# Patient Record
Sex: Female | Born: 1960 | Race: White | Hispanic: No | Marital: Married | Smoking: Current every day smoker
Health system: Southern US, Community
[De-identification: ages and names within clinical notes are randomized; demographics above are authoritative.]

## PROBLEM LIST (undated history)

## (undated) DIAGNOSIS — T7840XA Allergy, unspecified, initial encounter: Secondary | ICD-10-CM

## (undated) DIAGNOSIS — L570 Actinic keratosis: Secondary | ICD-10-CM

## (undated) DIAGNOSIS — G43909 Migraine, unspecified, not intractable, without status migrainosus: Secondary | ICD-10-CM

## (undated) DIAGNOSIS — M1990 Unspecified osteoarthritis, unspecified site: Secondary | ICD-10-CM

## (undated) DIAGNOSIS — K219 Gastro-esophageal reflux disease without esophagitis: Secondary | ICD-10-CM

## (undated) DIAGNOSIS — F419 Anxiety disorder, unspecified: Secondary | ICD-10-CM

## (undated) DIAGNOSIS — M25473 Effusion, unspecified ankle: Secondary | ICD-10-CM

## (undated) HISTORY — DX: Migraine, unspecified, not intractable, without status migrainosus: G43.909

## (undated) HISTORY — DX: Gastro-esophageal reflux disease without esophagitis: K21.9

## (undated) HISTORY — PX: ABDOMINAL HYSTERECTOMY: SHX81

## (undated) HISTORY — PX: PARTIAL HYSTERECTOMY: SHX80

## (undated) HISTORY — DX: Allergy, unspecified, initial encounter: T78.40XA

## (undated) HISTORY — DX: Actinic keratosis: L57.0

## (undated) HISTORY — DX: Effusion, unspecified ankle: M25.473

## (undated) HISTORY — PX: FRACTURE SURGERY: SHX138

## (undated) HISTORY — PX: BUNIONECTOMY: SHX129

---
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
## 1981-11-02 HISTORY — PX: OTHER SURGICAL HISTORY: SHX169

## 1984-11-02 HISTORY — PX: APPENDECTOMY: SHX54

## 2005-11-27 ENCOUNTER — Encounter: Payer: Self-pay | Admitting: Cardiovascular Disease

## 2005-12-03 ENCOUNTER — Ambulatory Visit: Payer: Self-pay | Admitting: Unknown Physician Specialty

## 2010-12-19 ENCOUNTER — Encounter: Payer: Self-pay | Admitting: Cardiovascular Disease

## 2010-12-20 ENCOUNTER — Encounter: Payer: Self-pay | Admitting: Cardiovascular Disease

## 2011-01-13 ENCOUNTER — Ambulatory Visit (INDEPENDENT_AMBULATORY_CARE_PROVIDER_SITE_OTHER): Payer: 59 | Admitting: Cardiovascular Disease

## 2011-01-13 ENCOUNTER — Encounter: Payer: Self-pay | Admitting: Cardiovascular Disease

## 2011-01-13 DIAGNOSIS — M79609 Pain in unspecified limb: Secondary | ICD-10-CM | POA: Insufficient documentation

## 2011-01-13 DIAGNOSIS — R002 Palpitations: Secondary | ICD-10-CM | POA: Insufficient documentation

## 2011-01-13 DIAGNOSIS — R209 Unspecified disturbances of skin sensation: Secondary | ICD-10-CM

## 2011-01-13 DIAGNOSIS — R51 Headache: Secondary | ICD-10-CM | POA: Insufficient documentation

## 2011-01-13 DIAGNOSIS — R519 Headache, unspecified: Secondary | ICD-10-CM | POA: Insufficient documentation

## 2011-01-14 ENCOUNTER — Telehealth: Payer: Self-pay | Admitting: Cardiovascular Disease
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
## 2011-01-20 NOTE — Progress Notes (Signed)
Summary: Treadmill  Phone Note Call from Patient Call back at [phone]   Caller: Self Call For: Erin Smith Summary of Call: Pt LMOM that she was calling to schedule a Treadmill for next week.  She needs it to be either Tuesday or Wednesday afternoon. Initial call taken by: Harlon Flor,  January 14, 2011 12:15 PM  Follow-up for Phone Call        pt is calling again to schedule treadmill. Call back [phone] Lysbeth Galas CMA  January 16, 2011 11:21 AM  Attepmted to call pt, LMOM TCB. I am looking at Thursday 01/22/11 @ 12:00, cannot do Tues or Wed. Lanny Hurst RN  January 16, 2011 1:35 PM   Spoke to pt, scheduled treadmill last case per Dr. Mariah Milling for 01/22/11 @ 1200. Pt ok with this. Follow-up by: Lanny Hurst RN,  January 16, 2011 3:24 PM

## 2011-01-22 ENCOUNTER — Ambulatory Visit (INDEPENDENT_AMBULATORY_CARE_PROVIDER_SITE_OTHER): Payer: 59

## 2011-01-22 DIAGNOSIS — R002 Palpitations: Secondary | ICD-10-CM

## 2011-01-22 DIAGNOSIS — R0789 Other chest pain: Secondary | ICD-10-CM

## 2011-01-22 DIAGNOSIS — M79609 Pain in unspecified limb: Secondary | ICD-10-CM

## 2011-01-22 NOTE — Progress Notes (Signed)
Ms. Erin Smith presents for a exercise treadmill today. She has had recent symptoms of arm discomfort radiating up to her neck. Treadmill was ordered to exclude ischemia as a cause of her symptoms.  Resting blood pressure was 110/60. Heart rate was 69 beats per minute. She exercise on a regular Bruce protocol for 10 minutes and 20 seconds. She achieved 12.9 METS. Peak blood pressure was 152/80, maximum heart rate 172 beats per minute. There was no significant ST changes concerning for ischemia. Maximum ST change was 1 mm though upsloping. She was asymptomatic through the treadmill. And in recovery. No symptoms of arm discomfort, neck discomfort chest pain or shortness of breath.  Final impression: Normal treadmill study, excellent exercise tolerance. No suggestion of ischemia based on her treadmill. No further workup is needed at this time. We have recommended continued exercise, monitoring her cholesterol closely. Repeat treadmill can be done in the future if the symptoms arise.

## 2011-02-13 ENCOUNTER — Encounter: Payer: Self-pay | Admitting: Cardiovascular Disease

## 2012-08-28 ENCOUNTER — Ambulatory Visit: Payer: Self-pay | Admitting: Physician Assistant

## 2012-08-28 LAB — RAPID STREP-A WITH REFLX: Micro Text Report: NEGATIVE

## 2012-11-02 HISTORY — PX: COLONOSCOPY: SHX5424

## 2012-11-03 LAB — HM PAP SMEAR: HM PAP: NEGATIVE

## 2013-03-22 IMAGING — CR DG CHEST 2V
1 series · 2 of 2 positions shown · non-contrast
Comparison: none

REASON FOR EXAM: cough and congestion for over a week
COMMENTS:

[Series 1: pa · 0.17mm/px · 2 of 2 slices shown]
[im 1/2]
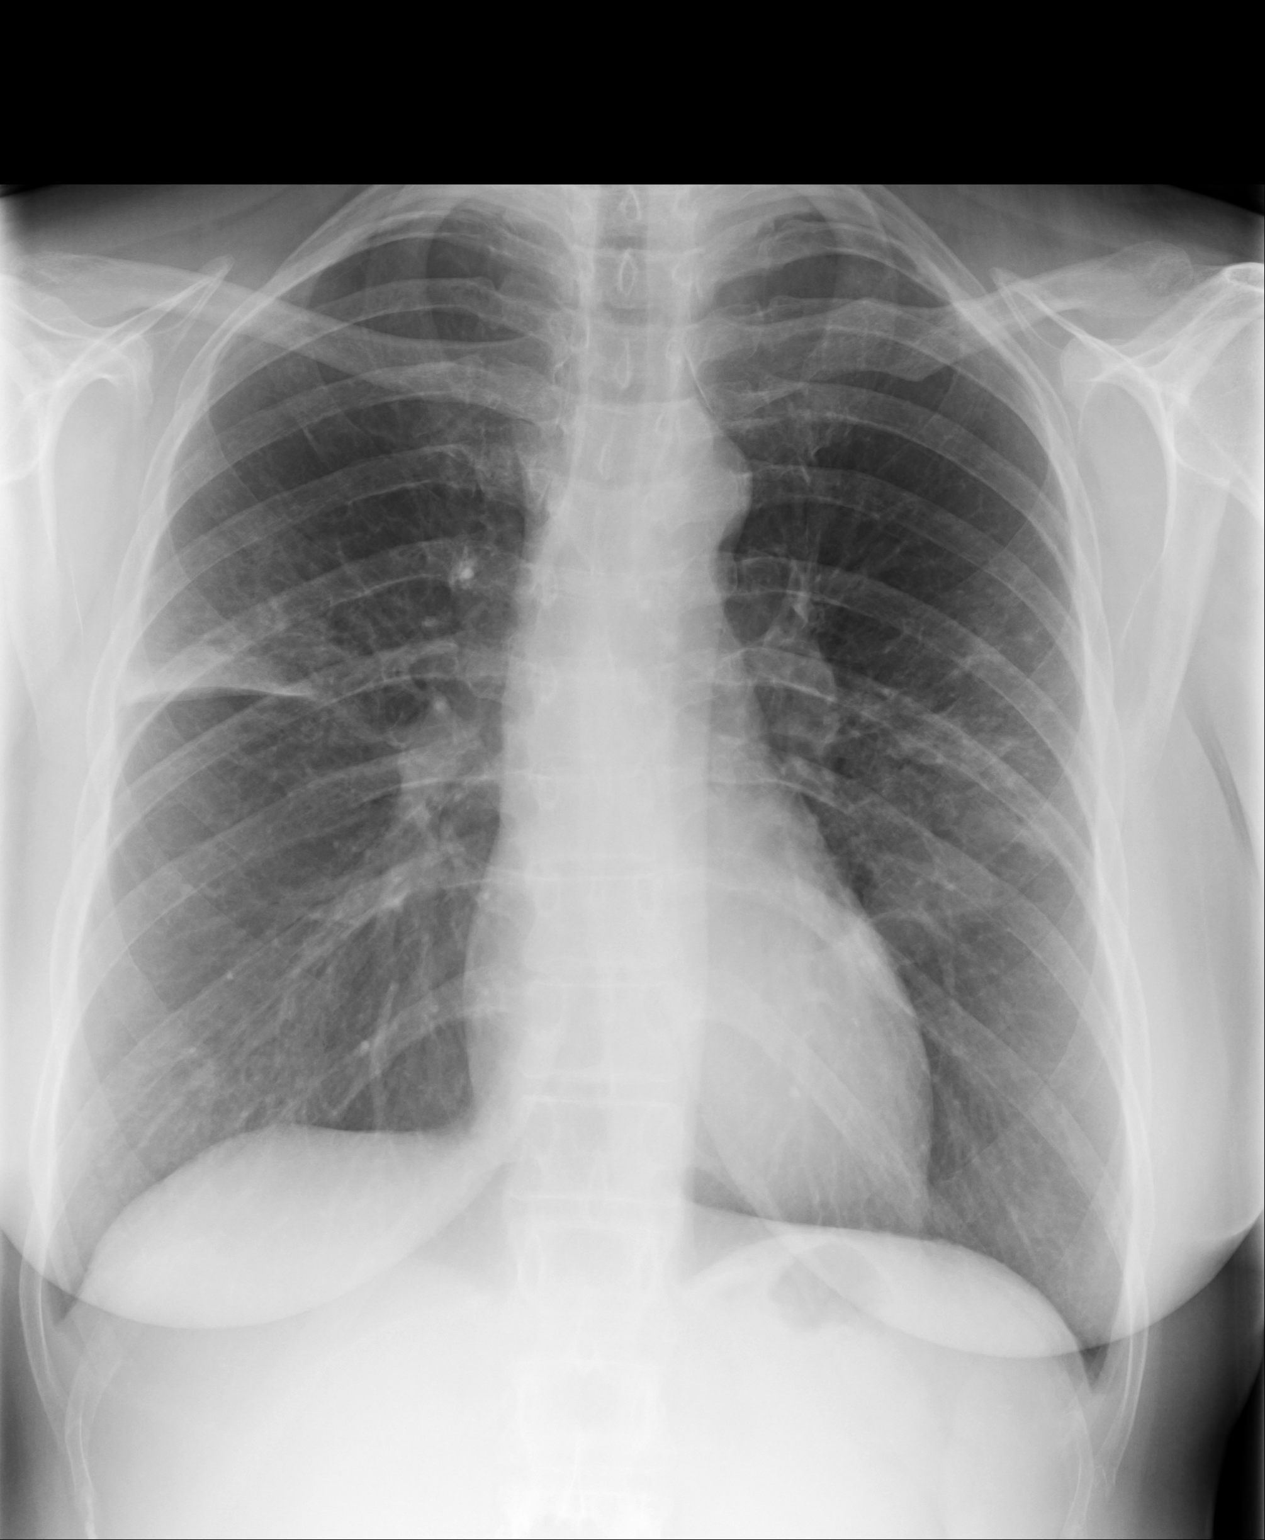
[im 2/2]
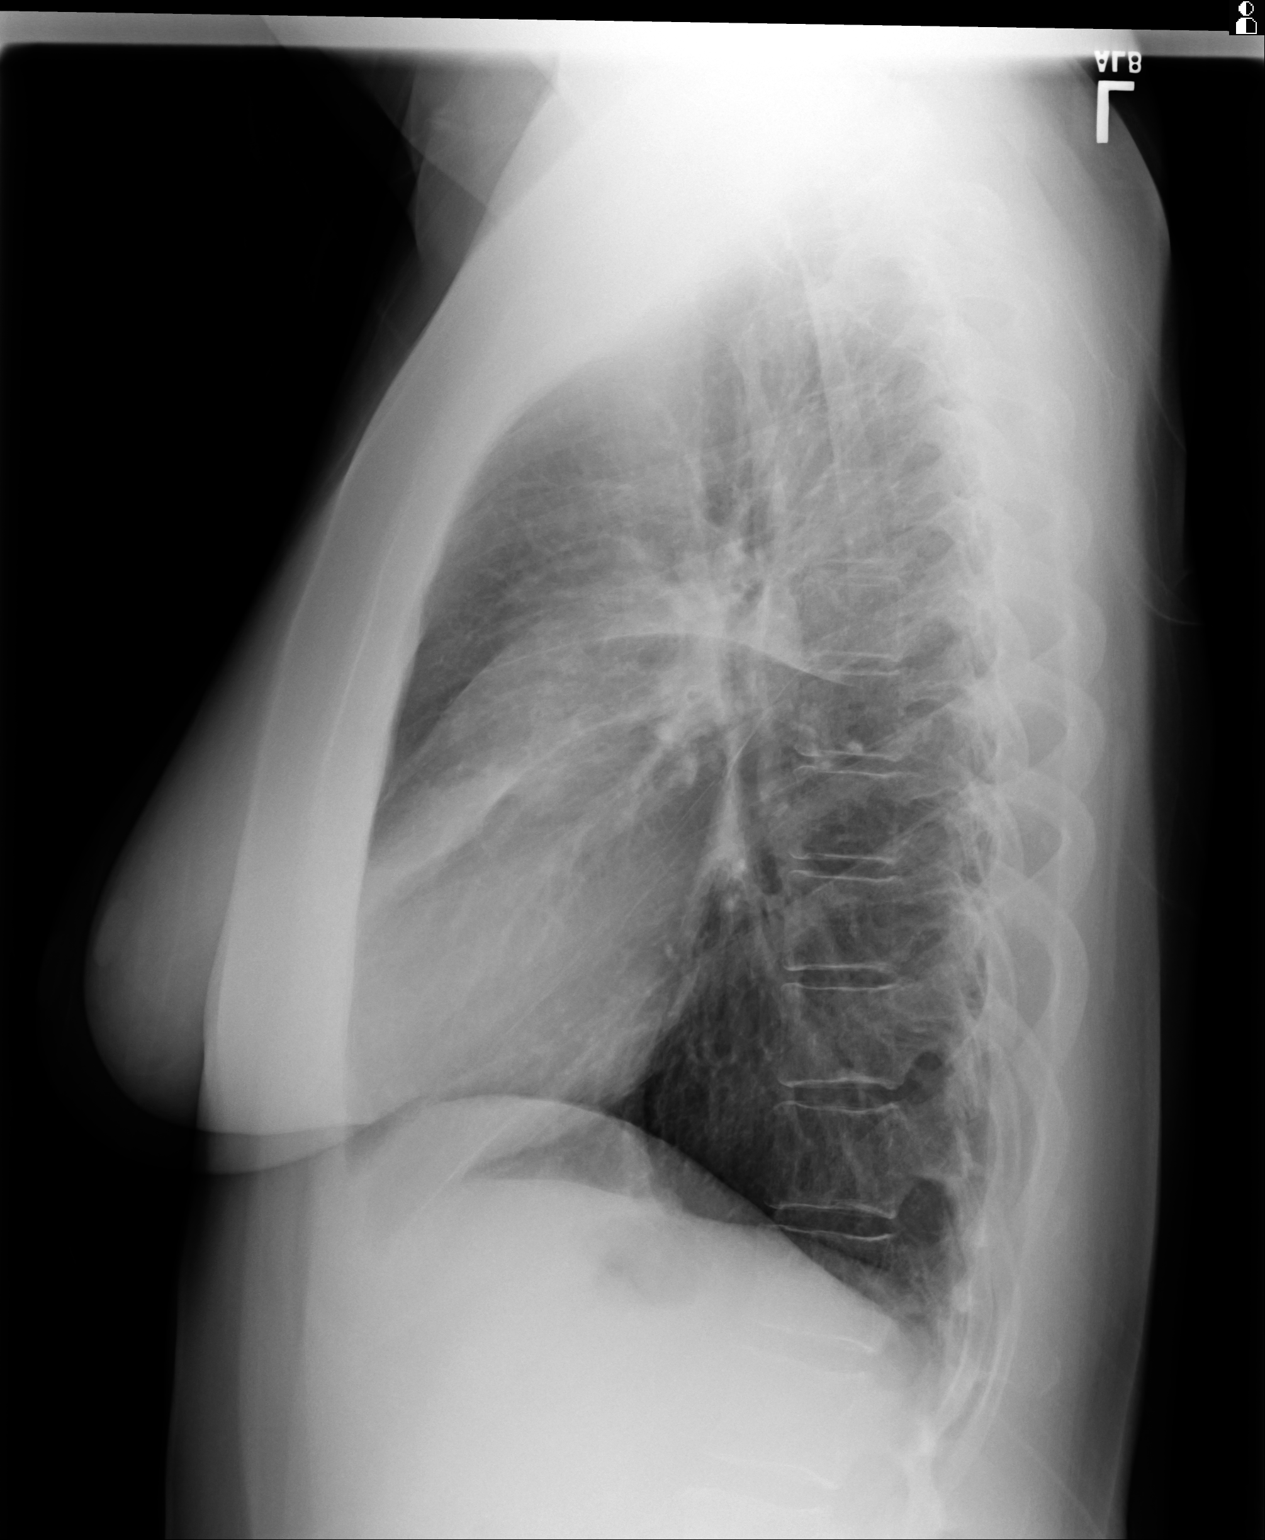

[2 of 2 positions shown; findings below may reference images not displayed]

PROCEDURE:     MDR - MDR CHEST PA(OR AP) AND LATERAL  - August 28, 2012  [DATE]

RESULT:

An area of increased density projects along the base of the right upper
lobe. There are also areas of ill-defined density projecting in the region
of the lingula.

The cardiac silhouette and visualized bony skeleton are unremarkable.
IMPRESSION: 1. Atelectasis versus infiltrate within the right upper lobe. Also of note;
there are also findings in the region of the right middle lobe lateral
segment.
2. Atelectasis versus infiltrate in the region of the lingula. Surveillance
of these findings is recommended.

## 2013-06-02 HISTORY — PX: ESOPHAGOGASTRODUODENOSCOPY: SHX1529

## 2013-06-02 LAB — HM COLONOSCOPY

## 2014-01-22 LAB — HM MAMMOGRAPHY: HM MAMMO: NORMAL

## 2014-02-16 ENCOUNTER — Ambulatory Visit (INDEPENDENT_AMBULATORY_CARE_PROVIDER_SITE_OTHER): Payer: 59 | Admitting: Cardiovascular Disease

## 2014-02-16 ENCOUNTER — Encounter (INDEPENDENT_AMBULATORY_CARE_PROVIDER_SITE_OTHER): Payer: Self-pay

## 2014-02-16 ENCOUNTER — Encounter: Payer: Self-pay | Admitting: Cardiovascular Disease

## 2014-02-16 VITALS — BP 124/88 | HR 65 | Ht 70.0 in | Wt 190.0 lb

## 2014-02-16 DIAGNOSIS — Z87891 Personal history of nicotine dependence: Secondary | ICD-10-CM

## 2014-02-16 DIAGNOSIS — E669 Obesity, unspecified: Secondary | ICD-10-CM

## 2014-02-16 DIAGNOSIS — F411 Generalized anxiety disorder: Secondary | ICD-10-CM

## 2014-02-16 DIAGNOSIS — F419 Anxiety disorder, unspecified: Secondary | ICD-10-CM

## 2014-02-16 DIAGNOSIS — R079 Chest pain, unspecified: Secondary | ICD-10-CM | POA: Insufficient documentation

## 2014-02-16 DIAGNOSIS — F17201 Nicotine dependence, unspecified, in remission: Secondary | ICD-10-CM | POA: Insufficient documentation

## 2014-02-16 DIAGNOSIS — I4892 Unspecified atrial flutter: Secondary | ICD-10-CM

## 2014-02-16 DIAGNOSIS — R002 Palpitations: Secondary | ICD-10-CM

## 2014-02-16 LAB — LIPID PANEL
Cholesterol: 222 mg/dL — AB (ref 0–200)
HDL: 70 mg/dL (ref 35–70)
LDL CALC: 128 mg/dL
Triglycerides: 120 mg/dL (ref 40–160)

## 2014-02-16 LAB — TSH: TSH: 1.34 u[IU]/mL (ref <=5.90)

## 2014-02-16 NOTE — Assessment & Plan Note (Signed)
She has significant anxiety with underlying family stressors. Recently given Xanax. Recommended a walking program

## 2014-02-16 NOTE — Assessment & Plan Note (Signed)
She's gaining weight since she stopped smoking. Recommended she watch her diet, started regular exercise program

## 2014-02-16 NOTE — Assessment & Plan Note (Signed)
Symptoms are atypical in nature. We have tried to reassure her that with her symptoms coming on at rest, likely associated with anxiety, no significant workup is needed. We have offered routine treadmill study as well as stress echocardiogram if she continues to have symptoms through the weekend. She will call us back early next week if she would like [place] to schedule this

## 2014-02-16 NOTE — Assessment & Plan Note (Signed)
No significant palpitations on propranolol. No further medication changes

## 2014-02-16 NOTE — Assessment & Plan Note (Signed)
She quit smoking 6 months ago. Complimented her on her ability to quit

## 2014-02-16 NOTE — Progress Notes (Signed)
Patient ID: Erin Smith, female    DOB: 06/01/1961, 52 y.o.   MRN: [id number]  HPI Comments: Erin Smith is a very pleasant 53 year old woman with history of smoking for 30 years, one half pack per day, borderline hyperlipidemia, history of anxiety, prior episodes of chest pain with normal stress test in 2012 in our office who presents with symptoms of chest tightness..  She reports that over the past few years she has had panic attacks. He describes these as a tightness in her back muscles radiating to her chest, into her mediastinal area. These have been worse recently and she wanted to make sure her heart was okay. She's been having 3-4 episodes per week now. Recently prescribed Xanax which she has not started. Symptoms seem to come on at rest, not typically with exertion. She feels that her bra is tight and symptoms are relieved by pulling her bra off or away from her chest.  She quit smoking 6 months ago. She currently works in [employer] She works having a cholesterol in the 190 range up to 200 In terms of her family history, mother and father are still alive, in her 1s, no significant cardiac disease. Father died of MI in his 66s. He was a heavy smoker.  EKG shows normal sinus rhythm with rate 65 beats per minute, no significant ST or T wave changes   Outpatient Encounter Prescriptions as of 02/16/2014  Medication Sig  . aspirin 81 MG tablet Take 81 mg by mouth daily.    Marland Kitchen esomeprazole (NEXIUM) 40 MG capsule Take 40 mg by mouth daily before breakfast.    . fexofenadine (ALLEGRA) 180 MG tablet Take 180 mg by mouth daily.  . propranolol (INDERAL) 20 MG tablet Take 20 mg by mouth 2 (two) times daily.    . ranitidine (ZANTAC) 75 MG tablet Take 75 mg by mouth at bedtime.    Review of Systems  Constitutional: Negative.   HENT: Negative.   Eyes: Negative.   Respiratory: Negative.   Cardiovascular: Positive for chest pain.  Gastrointestinal: Negative.   Endocrine:  Negative.   Musculoskeletal: Negative.   Skin: Negative.   Allergic/Immunologic: Negative.   Neurological: Negative.   Hematological: Negative.   Psychiatric/Behavioral: The patient is nervous/anxious.   All other systems reviewed and are negative.   BP 124/88  Pulse 65  Ht 5\' 10"  (1.778 m)  Wt 190 lb (86.183 kg)  BMI 27.26 kg/m2  Physical Exam  Nursing note and vitals reviewed. Constitutional: She is oriented to person, place, and time. She appears well-developed and well-nourished.  HENT:  Head: Normocephalic.  Nose: Nose normal.  Mouth/Throat: Oropharynx is clear and moist.  Eyes: Conjunctivae are normal. Pupils are equal, round, and reactive to light.  Neck: Normal range of motion. Neck supple. No JVD present.  Cardiovascular: Normal rate, regular rhythm, S1 normal, S2 normal, normal heart sounds and intact distal pulses.  Exam reveals no gallop and no friction rub.   No murmur heard. Pulmonary/Chest: Effort normal and breath sounds normal. No respiratory distress. She has no wheezes. She has no rales. She exhibits no tenderness.  Abdominal: Soft. Bowel sounds are normal. She exhibits no distension. There is no tenderness.  Musculoskeletal: Normal range of motion. She exhibits no edema and no tenderness.  Lymphadenopathy:    She has no cervical adenopathy.  Neurological: She is alert and oriented to person, place, and time. Coordination normal.  Skin: Skin is warm and dry. No rash noted. No erythema.  Psychiatric: She has a normal mood and affect. Her behavior is normal. Judgment and thought content normal.    Assessment and Plan

## 2014-02-16 NOTE — Patient Instructions (Signed)
You are doing well. No medication changes were made.  Please call the office for worsening chest symptoms We could order a stress test (treadmill vs treadmill stress echo)  Please call us if you have new issues that need to be addressed before your next appt.  Your physician wants you to follow-up in: 6 months.  You will receive a reminder letter in the mail two months in advance. If you don't receive a letter, please call our office to schedule the follow-up appointment.

## 2014-06-05 DIAGNOSIS — M25579 Pain in unspecified ankle and joints of unspecified foot: Secondary | ICD-10-CM | POA: Insufficient documentation

## 2014-07-26 ENCOUNTER — Encounter: Payer: Self-pay | Admitting: Cardiovascular Disease

## 2014-09-20 ENCOUNTER — Ambulatory Visit (INDEPENDENT_AMBULATORY_CARE_PROVIDER_SITE_OTHER): Payer: 59 | Admitting: Cardiovascular Disease

## 2014-09-20 ENCOUNTER — Encounter: Payer: Self-pay | Admitting: Cardiovascular Disease

## 2014-09-20 VITALS — BP 102/70 | HR 70 | Ht 70.0 in | Wt 186.8 lb

## 2014-09-20 DIAGNOSIS — Z720 Tobacco use: Secondary | ICD-10-CM

## 2014-09-20 DIAGNOSIS — M25473 Effusion, unspecified ankle: Secondary | ICD-10-CM

## 2014-09-20 DIAGNOSIS — E669 Obesity, unspecified: Secondary | ICD-10-CM

## 2014-09-20 DIAGNOSIS — R079 Chest pain, unspecified: Secondary | ICD-10-CM

## 2014-09-20 DIAGNOSIS — R002 Palpitations: Secondary | ICD-10-CM

## 2014-09-20 DIAGNOSIS — F419 Anxiety disorder, unspecified: Secondary | ICD-10-CM

## 2014-09-20 DIAGNOSIS — Z87891 Personal history of nicotine dependence: Secondary | ICD-10-CM

## 2014-09-20 DIAGNOSIS — R600 Localized edema: Secondary | ICD-10-CM

## 2014-09-20 HISTORY — DX: Effusion, unspecified ankle: M25.473

## 2014-09-20 NOTE — Assessment & Plan Note (Signed)
She stopped smoking one year ago. No further smoking

## 2014-09-20 NOTE — Patient Instructions (Addendum)
You are doing well. No medication changes were made.  We will order an echocardiogram for leg swelling, chest pain, palpitations  Please call us if you have new issues that need to be addressed before your next appt.  Your physician wants you to follow-up in: 12 months.  You will receive a reminder letter in the mail two months in advance. If you don't receive a letter, please call our office to schedule the follow-up appointment.

## 2014-09-20 NOTE — Assessment & Plan Note (Signed)
Occasional fluttering in her chest, likely ectopy. Recommended she stay on her low-dose propranolol, take extra propranolol as needed for breakthrough symptoms

## 2014-09-20 NOTE — Assessment & Plan Note (Signed)
Likely having some symptoms of anxiety. She is anxious about chest pain, ankle swelling, as well as several other issues. Echocardiogram pending. Recommended a regular walking program

## 2014-09-20 NOTE — Progress Notes (Signed)
Patient ID: Erin Smith, female    DOB: May 16, 1961, 53 y.o.   MRN: [id number]  HPI Comments: Erin Smith is a very pleasant 53 year old woman with history of smoking for 30 years, one half pack per day, borderline hyperlipidemia, history of anxiety, prior episodes of chest pain with normal stress test in 2012 in our office previously evaluated for chest tightness, who presents for routine follow-up of her chest pain  In follow-up today, she reports that she is doing well. She does notice some fluttering in her chest which sometimes makes her cough. Symptoms have been better on low-dose propranolol 10 mg twice a day She has been trying to wean herself off the propranolol but sometimes has more fluttering in the day Today he again reports that she feels her brides too tight, some chest pain symptoms. She has reported this previously. Symptoms at rest  EKG today shows normal sinus rhythm with rate 70 bpm, no significant ST or T wave changes  Other past medical history History of panic attacks. He describes these as a tightness in her back muscles radiating to her chest, into her mediastinal area. These have been worse recently and she wanted to make sure her heart was okay. She's been having 3-4 episodes per week now. Previously prescribed Xanax, uncertain if she started this   She quit smoking one year ago She currently works in [employer] She works having a cholesterol in the 190 range up to 200 In terms of her family history, mother and father are still alive, in her 52s, no significant cardiac disease. Father died of MI in his 61s. He was a heavy smoker.     Outpatient Encounter Prescriptions as of 09/20/2014  Medication Sig  . aspirin 81 MG tablet Take 81 mg by mouth daily.    Marland Kitchen esomeprazole (NEXIUM) 40 MG capsule Take 40 mg by mouth daily before breakfast.    . propranolol (INDERAL) 20 MG tablet Take 10 mg by mouth 2 (two) times daily.   . ranitidine (ZANTAC) 75 MG  tablet Take 75 mg by mouth at bedtime.  . [DISCONTINUED] fexofenadine (ALLEGRA) 180 MG tablet Take 180 mg by mouth daily.   Social history  reports that she quit smoking about 3 years ago. Her smoking use included Cigarettes. She has a 10 pack-year smoking history. She does not have any smokeless tobacco history on file. She reports that she drinks alcohol. She reports that she does not use illicit drugs.    Review of Systems  Constitutional: Negative.   Respiratory: Positive for shortness of breath.   Cardiovascular: Positive for chest pain.  Gastrointestinal: Negative.   Neurological: Negative.   Hematological: Negative.   Psychiatric/Behavioral: The patient is nervous/anxious.   All other systems reviewed and are negative.   BP 102/70 mmHg  Pulse 70  Ht 5\' 10"  (1.778 m)  Wt 186 lb 12 oz (84.709 kg)  BMI 26.80 kg/m2  Physical Exam  Constitutional: She is oriented to person, place, and time. She appears well-developed and well-nourished.  HENT:  Head: Normocephalic.  Nose: Nose normal.  Mouth/Throat: Oropharynx is clear and moist.  Eyes: Conjunctivae are normal. Pupils are equal, round, and reactive to light.  Neck: Normal range of motion. Neck supple. No JVD present.  Cardiovascular: Normal rate, regular rhythm, S1 normal, S2 normal, normal heart sounds and intact distal pulses.  Exam reveals no gallop and no friction rub.   No murmur heard. Pulmonary/Chest: Effort normal and breath sounds normal. No respiratory  distress. She has no wheezes. She has no rales. She exhibits no tenderness.  Abdominal: Soft. Bowel sounds are normal. She exhibits no distension. There is no tenderness.  Musculoskeletal: Normal range of motion. She exhibits no edema or tenderness.  Lymphadenopathy:    She has no cervical adenopathy.  Neurological: She is alert and oriented to person, place, and time. Coordination normal.  Skin: Skin is warm and dry. No rash noted. No erythema.  Psychiatric: She  has a normal mood and affect. Her behavior is normal. Judgment and thought content normal.    Assessment and Plan  Nursing note and vitals reviewed.

## 2014-09-20 NOTE — Assessment & Plan Note (Signed)
She is concerned by 40 and ankle swelling. No edema noted on today's visit. Likely from venous insufficiency and standing on her feet all day. Echocardiogram has been ordered to rule out other cardiac pathology

## 2014-09-20 NOTE — Assessment & Plan Note (Signed)
We have encouraged continued exercise, careful diet management in an effort to lose weight. 

## 2014-09-20 NOTE — Assessment & Plan Note (Addendum)
Atypical chest pain. Typically coming on at rest. She has commented on the symptoms in the past. We have discussed the very treatment options with her. Echocardiogram has been ordered given her ankle edema Treadmill again was discussed. She has declined

## 2014-09-21 ENCOUNTER — Other Ambulatory Visit: Payer: Self-pay

## 2014-09-21 ENCOUNTER — Other Ambulatory Visit (INDEPENDENT_AMBULATORY_CARE_PROVIDER_SITE_OTHER): Payer: 59

## 2014-09-21 DIAGNOSIS — R079 Chest pain, unspecified: Secondary | ICD-10-CM

## 2014-09-21 DIAGNOSIS — R002 Palpitations: Secondary | ICD-10-CM

## 2014-09-21 DIAGNOSIS — R600 Localized edema: Secondary | ICD-10-CM

## 2014-10-02 NOTE — Telephone Encounter (Signed)
This encounter was created in error - please disregard.

## 2015-03-12 ENCOUNTER — Encounter: Payer: Self-pay | Admitting: Internal Medicine

## 2015-03-12 DIAGNOSIS — G43909 Migraine, unspecified, not intractable, without status migrainosus: Secondary | ICD-10-CM | POA: Insufficient documentation

## 2015-03-12 DIAGNOSIS — M26659 Arthropathy of unspecified temporomandibular joint: Secondary | ICD-10-CM | POA: Insufficient documentation

## 2015-03-12 DIAGNOSIS — D126 Benign neoplasm of colon, unspecified: Secondary | ICD-10-CM | POA: Insufficient documentation

## 2015-03-12 DIAGNOSIS — E785 Hyperlipidemia, unspecified: Secondary | ICD-10-CM | POA: Insufficient documentation

## 2015-03-12 DIAGNOSIS — K219 Gastro-esophageal reflux disease without esophagitis: Secondary | ICD-10-CM | POA: Insufficient documentation

## 2015-03-12 DIAGNOSIS — N951 Menopausal and female climacteric states: Secondary | ICD-10-CM | POA: Insufficient documentation

## 2015-03-12 DIAGNOSIS — M26609 Unspecified temporomandibular joint disorder, unspecified side: Secondary | ICD-10-CM

## 2015-04-24 ENCOUNTER — Encounter: Payer: Self-pay | Admitting: Internal Medicine

## 2015-04-24 ENCOUNTER — Ambulatory Visit (INDEPENDENT_AMBULATORY_CARE_PROVIDER_SITE_OTHER): Payer: 59 | Admitting: Internal Medicine

## 2015-04-24 VITALS — BP 110/70 | HR 64 | Ht 69.0 in | Wt 187.0 lb

## 2015-04-24 DIAGNOSIS — M25473 Effusion, unspecified ankle: Secondary | ICD-10-CM | POA: Diagnosis not present

## 2015-04-24 DIAGNOSIS — T148 Other injury of unspecified body region: Secondary | ICD-10-CM

## 2015-04-24 DIAGNOSIS — W57XXXA Bitten or stung by nonvenomous insect and other nonvenomous arthropods, initial encounter: Secondary | ICD-10-CM

## 2015-04-24 DIAGNOSIS — B9789 Other viral agents as the cause of diseases classified elsewhere: Secondary | ICD-10-CM

## 2015-04-24 DIAGNOSIS — J069 Acute upper respiratory infection, unspecified: Secondary | ICD-10-CM
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
## 2015-04-24 NOTE — Patient Instructions (Signed)
Use cortisone cream topically to rash as needed

## 2015-09-06 ENCOUNTER — Other Ambulatory Visit: Payer: Self-pay | Admitting: Internal Medicine

## 2015-10-09 ENCOUNTER — Telehealth: Payer: Self-pay | Admitting: Cardiovascular Disease

## 2015-10-09 NOTE — Telephone Encounter (Signed)
3 attempts to schedule from recall list.  LMOV to call office.  Deleting recall.   °

## 2015-10-31 ENCOUNTER — Other Ambulatory Visit: Payer: Self-pay | Admitting: Internal Medicine

## 2015-11-26 NOTE — Telephone Encounter (Signed)
Pt did not answer her house or cell phone - left message on pt's cell phone for her to call back and schedule her an appt (11/26/15 @ 2:16)  Pt did not answer her house or cell phone - left message on cell phone for pt to call back to schedule an appt. (11/19/15 @ 1:55)   Pt did not answer her house or cell phone - left message on cell phone for pt to call back to schedule an appt. On 10/31/15 @ 9:25

## 2015-12-08 ENCOUNTER — Other Ambulatory Visit: Payer: Self-pay | Admitting: Internal Medicine

## 2016-01-10 ENCOUNTER — Other Ambulatory Visit: Payer: Self-pay | Admitting: Internal Medicine

## 2016-01-10 ENCOUNTER — Telehealth: Payer: Self-pay

## 2016-01-10 MED ORDER — ALPRAZOLAM 0.25 MG PO TABS: 0.2500 mg | ORAL_TABLET | Freq: Two times a day (BID) | ORAL | Status: DC | PRN

## 2016-01-10 NOTE — Telephone Encounter (Signed)
Patient states that she would like to know if you could send her in a couple days worth of Xanax to the pharmacy. States that her son just got deployed to the [place] and she is having a hard time sleeping at night due to her nerves. Please advise.

## 2016-03-02 ENCOUNTER — Encounter: Payer: Self-pay | Admitting: Internal Medicine

## 2016-03-02 ENCOUNTER — Ambulatory Visit (INDEPENDENT_AMBULATORY_CARE_PROVIDER_SITE_OTHER): Payer: 59 | Admitting: Internal Medicine

## 2016-03-02 VITALS — BP 118/82 | HR 72 | Resp 16 | Ht 70.0 in | Wt 186.0 lb

## 2016-03-02 DIAGNOSIS — G43909 Migraine, unspecified, not intractable, without status migrainosus: Secondary | ICD-10-CM

## 2016-03-02 DIAGNOSIS — F419 Anxiety disorder, unspecified: Secondary | ICD-10-CM | POA: Diagnosis not present

## 2016-03-02 DIAGNOSIS — K219 Gastro-esophageal reflux disease without esophagitis: Secondary | ICD-10-CM | POA: Diagnosis not present

## 2016-03-02 DIAGNOSIS — R002 Palpitations: Secondary | ICD-10-CM

## 2016-03-02 DIAGNOSIS — Z0000 Encounter for general adult medical examination without abnormal findings: Secondary | ICD-10-CM | POA: Diagnosis not present

## 2016-03-02 LAB — POCT URINALYSIS DIPSTICK
BILIRUBIN UA: NEGATIVE
GLUCOSE UA: NEGATIVE
KETONES UA: NEGATIVE
Leukocytes, UA: NEGATIVE
Nitrite, UA: NEGATIVE
Protein, UA: NEGATIVE
RBC UA: NEGATIVE
SPEC GRAV UA: 1.01
UROBILINOGEN UA: 0.2
pH, UA: 7

## 2016-03-02 MED ORDER — PROPRANOLOL HCL 20 MG PO TABS: 20.0000 mg | ORAL_TABLET | Freq: Two times a day (BID) | ORAL | Status: DC

## 2016-03-02 MED ORDER — ALPRAZOLAM 0.25 MG PO TABS: 0.2500 mg | ORAL_TABLET | Freq: Two times a day (BID) | ORAL | Status: DC | PRN
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
## 2016-03-02 NOTE — Patient Instructions (Signed)
Breast Self-Awareness Practicing breast self-awareness may pick up problems early, prevent significant medical complications, and possibly save your life. By practicing breast self-awareness, you can become familiar with how your breasts look and feel and if your breasts are changing. This allows you to notice changes early. It can also offer you some reassurance that your breast health is good. One way to learn what is normal for your breasts and whether your breasts are changing is to do a breast self-exam. If you find a lump or something that was not present in the past, it is best to contact your caregiver right away. Other findings that should be evaluated by your caregiver include nipple discharge, especially if it is bloody; skin changes or reddening; areas where the skin seems to be pulled in (retracted); or new lumps and bumps. Breast pain is seldom associated with cancer (malignancy), but should also be evaluated by a caregiver. HOW TO PERFORM A BREAST SELF-EXAM The best time to examine your breasts is 5-7 days after your menstrual period is over. During menstruation, the breasts are lumpier, and it may be more difficult to pick up changes. If you do not menstruate, have reached menopause, or had your uterus removed (hysterectomy), you should examine your breasts at regular intervals, such as monthly. If you are breastfeeding, examine your breasts after a feeding or after using a breast pump. Breast implants do not decrease the risk for lumps or tumors, so continue to perform breast self-exams as recommended. Talk to your caregiver about how to determine the difference between the implant and breast tissue. Also, talk about the amount of pressure you should use during the exam. Over time, you will become more familiar with the variations of your breasts and more comfortable with the exam. A breast self-exam requires you to remove all your clothes above the waist. 1. Look at your breasts and nipples.  Stand in front of a mirror in a room with good lighting. With your hands on your hips, push your hands firmly downward. Look for a difference in shape, contour, and size from one breast to the other (asymmetry). Asymmetry includes puckers, dips, or bumps. Also, look for skin changes, such as reddened or scaly areas on the breasts. Look for nipple changes, such as discharge, dimpling, repositioning, or redness. 2. Carefully feel your breasts. This is best done either in the shower or tub while using soapy water or when flat on your back. Place the arm (on the side of the breast you are examining) above your head. Use the pads (not the fingertips) of your three middle fingers on your opposite hand to feel your breasts. Start in the underarm area and use  inch (2 cm) overlapping circles to feel your breast. Use 3 different levels of pressure (light, medium, and firm pressure) at each circle before moving to the next circle. The light pressure is needed to feel the tissue closest to the skin. The medium pressure will help to feel breast tissue a little deeper, while the firm pressure is needed to feel the tissue close to the ribs. Continue the overlapping circles, moving downward over the breast until you feel your ribs below your breast. Then, move one finger-width towards the center of the body. Continue to use the  inch (2 cm) overlapping circles to feel your breast as you move slowly up toward the collar bone (clavicle) near the base of the neck. Continue the up and down exam using all 3 pressures until you reach the   middle of the chest. Do this with each breast, carefully feeling for lumps or changes. 3.  Keep a written record with breast changes or normal findings for each breast. By writing this information down, you do not need to depend only on memory for size, tenderness, or location. Write down where you are in your menstrual cycle, if you are still menstruating. Breast tissue can have some lumps or  thick tissue. However, see your caregiver if you find anything that concerns you.  SEEK MEDICAL CARE IF:  You see a change in shape, contour, or size of your breasts or nipples.   You see skin changes, such as reddened or scaly areas on the breasts or nipples.   You have an unusual discharge from your nipples.   You feel a new lump or unusually thick areas.    This information is not intended to replace advice given to you by your health care provider. Make sure you discuss any questions you have with your health care provider.   Document Released: 10/19/2005 Document Revised: 10/05/2012 Document Reviewed: 02/03/2012 Elsevier Interactive Patient Education 2016 [employer]  

## 2016-03-03 LAB — COMPREHENSIVE METABOLIC PANEL
ALT: 28 IU/L (ref 0–32)
AST: 28 IU/L (ref 0–40)
Albumin/Globulin Ratio: 2 (ref 1.2–2.2)
Albumin: 4.8 g/dL (ref 3.5–5.5)
Alkaline Phosphatase: 69 IU/L (ref 39–117)
BUN / CREAT RATIO: 11 (ref 9–23)
BUN: 7 mg/dL (ref 6–24)
Bilirubin Total: 0.2 mg/dL (ref 0.0–1.2)
CALCIUM: 9.9 mg/dL (ref 8.7–10.2)
CHLORIDE: 99 mmol/L (ref 96–106)
CO2: 26 mmol/L (ref 18–29)
Creatinine, Ser: 0.65 mg/dL (ref 0.57–1.00)
GFR, EST AFRICAN AMERICAN: 117 mL/min/{1.73_m2} (ref >59)
GFR, EST NON AFRICAN AMERICAN: 102 mL/min/{1.73_m2} (ref >59)
GLOBULIN, TOTAL: 2.4 g/dL (ref 1.5–4.5)
Glucose: 103 mg/dL — ABNORMAL HIGH (ref 65–99)
POTASSIUM: 4.7 mmol/L (ref 3.5–5.2)
SODIUM: 141 mmol/L (ref 134–144)
Total Protein: 7.2 g/dL (ref 6.0–8.5)

## 2016-03-03 LAB — LIPID PANEL
CHOL/HDL RATIO: 3.3 ratio (ref 0.0–4.4)
Cholesterol, Total: 228 mg/dL — ABNORMAL HIGH (ref 100–199)
HDL: 70 mg/dL (ref >39)
LDL Calculated: 135 mg/dL — ABNORMAL HIGH (ref 0–99)
Triglycerides: 117 mg/dL (ref 0–149)
VLDL Cholesterol Cal: 23 mg/dL (ref 5–40)

## 2016-03-03 LAB — CBC WITH DIFFERENTIAL/PLATELET
Basophils Absolute: 0 10*3/uL (ref 0.0–0.2)
Basos: 1 %
EOS (ABSOLUTE): 0.1 10*3/uL (ref 0.0–0.4)
EOS: 2 %
HEMATOCRIT: 37.6 % (ref 34.0–46.6)
Hemoglobin: 12.2 g/dL (ref 11.1–15.9)
IMMATURE GRANULOCYTES: 0 %
Immature Grans (Abs): 0 10*3/uL (ref 0.0–0.1)
LYMPHS: 44 %
Lymphocytes Absolute: 1.9 10*3/uL (ref 0.7–3.1)
MCH: 28.6 pg (ref 26.6–33.0)
MCHC: 32.4 g/dL (ref 31.5–35.7)
MCV: 88 fL (ref 79–97)
MONOS ABS: 0.2 10*3/uL (ref 0.1–0.9)
Monocytes: 5 %
NEUTROS PCT: 48 %
Neutrophils Absolute: 2 10*3/uL (ref 1.4–7.0)
PLATELETS: 299 10*3/uL (ref 150–379)
RBC: 4.27 x10E6/uL (ref 3.77–5.28)
RDW: 14 % (ref 12.3–15.4)
WBC: 4.3 10*3/uL (ref 3.4–10.8)

## 2016-03-03 LAB — TSH: TSH: 1.16 u[IU]/mL (ref 0.450–4.500)

## 2016-03-07 ENCOUNTER — Other Ambulatory Visit: Payer: Self-pay | Admitting: Internal Medicine

## 2016-06-08 ENCOUNTER — Other Ambulatory Visit: Payer: Self-pay | Admitting: Internal Medicine

## 2016-06-08 DIAGNOSIS — F419 Anxiety disorder, unspecified: Secondary | ICD-10-CM

## 2016-10-05 ENCOUNTER — Other Ambulatory Visit: Payer: Self-pay | Admitting: Internal Medicine

## 2016-10-05 DIAGNOSIS — F419 Anxiety disorder, unspecified: Secondary | ICD-10-CM

## 2016-10-06 NOTE — Telephone Encounter (Signed)
pts coming in on 5/2 for her cpe

## 2016-12-07 ENCOUNTER — Encounter: Payer: Self-pay | Admitting: Internal Medicine

## 2016-12-07 ENCOUNTER — Ambulatory Visit (INDEPENDENT_AMBULATORY_CARE_PROVIDER_SITE_OTHER): Payer: BC Managed Care – PPO | Admitting: Internal Medicine

## 2016-12-07 VITALS — BP 120/88 | HR 84 | Temp 98.6°F | Ht 70.0 in | Wt 186.0 lb

## 2016-12-07 DIAGNOSIS — R69 Illness, unspecified: Secondary | ICD-10-CM

## 2016-12-07 DIAGNOSIS — J111 Influenza due to unidentified influenza virus with other respiratory manifestations: Secondary | ICD-10-CM

## 2016-12-07 MED ORDER — OSELTAMIVIR PHOSPHATE 75 MG PO CAPS: 75.0000 mg | ORAL_CAPSULE | Freq: Two times a day (BID) | ORAL | 0 refills | Status: DC

## 2016-12-07 MED ORDER — GUAIFENESIN-CODEINE 100-10 MG/5ML PO SYRP: 5.0000 mL | ORAL_SOLUTION | Freq: Three times a day (TID) | ORAL | 0 refills | Status: DC | PRN
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
## 2016-12-07 NOTE — Patient Instructions (Signed)

## 2016-12-22 ENCOUNTER — Other Ambulatory Visit: Payer: Self-pay | Admitting: Internal Medicine

## 2017-03-03 ENCOUNTER — Encounter: Payer: Self-pay | Admitting: Internal Medicine

## 2017-03-03 ENCOUNTER — Ambulatory Visit (INDEPENDENT_AMBULATORY_CARE_PROVIDER_SITE_OTHER): Payer: BC Managed Care – PPO | Admitting: Internal Medicine

## 2017-03-03 VITALS — BP 102/70 | HR 79 | Ht 70.0 in | Wt 189.0 lb

## 2017-03-03 DIAGNOSIS — G43909 Migraine, unspecified, not intractable, without status migrainosus: Secondary | ICD-10-CM

## 2017-03-03 DIAGNOSIS — Z1239 Encounter for other screening for malignant neoplasm of breast: Secondary | ICD-10-CM

## 2017-03-03 DIAGNOSIS — F419 Anxiety disorder, unspecified: Secondary | ICD-10-CM

## 2017-03-03 DIAGNOSIS — Z1231 Encounter for screening mammogram for malignant neoplasm of breast: Secondary | ICD-10-CM | POA: Diagnosis not present

## 2017-03-03 DIAGNOSIS — Z0000 Encounter for general adult medical examination without abnormal findings: Secondary | ICD-10-CM | POA: Diagnosis not present

## 2017-03-03 DIAGNOSIS — K219 Gastro-esophageal reflux disease without esophagitis: Secondary | ICD-10-CM

## 2017-03-03 LAB — POCT URINALYSIS DIPSTICK
BILIRUBIN UA: NEGATIVE
Glucose, UA: NEGATIVE
Ketones, UA: NEGATIVE
LEUKOCYTES UA: NEGATIVE
NITRITE UA: NEGATIVE
PH UA: 6 (ref 5.0–8.0)
PROTEIN UA: NEGATIVE
RBC UA: NEGATIVE
Spec Grav, UA: <=1.005 — AB (ref 1.010–1.025)
UROBILINOGEN UA: 0.2 U/dL

## 2017-03-03 MED ORDER — ALPRAZOLAM 0.25 MG PO TABS: ORAL_TABLET | ORAL | 1 refills | Status: DC

## 2017-03-03 MED ORDER — PROPRANOLOL HCL 20 MG PO TABS: 20.0000 mg | ORAL_TABLET | Freq: Two times a day (BID) | ORAL | 12 refills | Status: DC

## 2017-03-03 NOTE — Patient Instructions (Signed)
Breast Self-Awareness Breast self-awareness means being familiar with how your breasts look and feel. It involves checking your breasts regularly and reporting any changes to your health care provider. Practicing breast self-awareness is important. A change in your breasts can be a sign of a serious medical problem. Being familiar with how your breasts look and feel allows you to find any problems early, when treatment is more likely to be successful. All women should practice breast self-awareness, including women who have had breast implants. How to do a breast self-exam One way to learn what is normal for your breasts and whether your breasts are changing is to do a breast self-exam. To do a breast self-exam: Look for Changes   1. Remove all the clothing above your waist. 2. Stand in front of a mirror in a room with good lighting. 3. Put your hands on your hips. 4. Push your hands firmly downward. 5. Compare your breasts in the mirror. Look for differences between them (asymmetry), such as:  Differences in shape.  Differences in size.  Puckers, dips, and bumps in one breast and not the other. 6. Look at each breast for changes in your skin, such as:  Redness.  Scaly areas. 7. Look for changes in your nipples, such as:  Discharge.  Bleeding.  Dimpling.  Redness.  A change in position. Feel for Changes   Carefully feel your breasts for lumps and changes. It is best to do this while lying on your back on the floor and again while sitting or standing in the shower or tub with soapy water on your skin. Feel each breast in the following way:  Place the arm on the side of the breast you are examining above your head.  Feel your breast with the other hand.  Start in the nipple area and make  inch (2 cm) overlapping circles to feel your breast. Use the pads of your three middle fingers to do this. Apply light pressure, then medium pressure, then firm pressure. The light pressure  will allow you to feel the tissue closest to the skin. The medium pressure will allow you to feel the tissue that is a little deeper. The firm pressure will allow you to feel the tissue close to the ribs.  Continue the overlapping circles, moving downward over the breast until you feel your ribs below your breast.  Move one finger-width toward the center of the body. Continue to use the  inch (2 cm) overlapping circles to feel your breast as you move slowly up toward your collarbone.  Continue the up and down exam using all three pressures until you reach your armpit. Write Down What You Find   Write down what is normal for each breast and any changes that you find. Keep a written record with breast changes or normal findings for each breast. By writing this information down, you do not need to depend only on memory for size, tenderness, or location. Write down where you are in your menstrual cycle, if you are still menstruating. If you are having trouble noticing differences in your breasts, do not get discouraged. With time you will become more familiar with the variations in your breasts and more comfortable with the exam. How often should I examine my breasts? Examine your breasts every month. If you are breastfeeding, the best time to examine your breasts is after a feeding or after using a breast pump. If you menstruate, the best time to examine your breasts is 5-7 days  after your period is over. During your period, your breasts are lumpier, and it may be more difficult to notice changes. When should I see my health care provider? See your health care provider if you notice:  A change in shape or size of your breasts or nipples.  A change in the skin of your breast or nipples, such as a reddened or scaly area.  Unusual discharge from your nipples.  A lump or thick area that was not there before.  Pain in your breasts.  Anything that concerns you. This information is not intended to  replace advice given to you by your health care provider. Make sure you discuss any questions you have with your health care provider. Document Released: 10/19/2005 Document Revised: 03/26/2016 Document Reviewed: 09/08/2015 Elsevier Interactive Patient Education  2017 [employer].
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
## 2017-03-04 LAB — COMPREHENSIVE METABOLIC PANEL
A/G RATIO: 1.9 (ref 1.2–2.2)
ALBUMIN: 4.7 g/dL (ref 3.5–5.5)
ALT: 21 IU/L (ref 0–32)
AST: 23 IU/L (ref 0–40)
Alkaline Phosphatase: 63 IU/L (ref 39–117)
BUN/Creatinine Ratio: 11 (ref 9–23)
BUN: 8 mg/dL (ref 6–24)
Bilirubin Total: 0.3 mg/dL (ref 0.0–1.2)
CO2: 26 mmol/L (ref 18–29)
Calcium: 10 mg/dL (ref 8.7–10.2)
Chloride: 99 mmol/L (ref 96–106)
Creatinine, Ser: 0.72 mg/dL (ref 0.57–1.00)
GFR calc Af Amer: 110 mL/min/{1.73_m2} (ref >59)
GFR, EST NON AFRICAN AMERICAN: 95 mL/min/{1.73_m2} (ref >59)
GLOBULIN, TOTAL: 2.5 g/dL (ref 1.5–4.5)
Glucose: 97 mg/dL (ref 65–99)
POTASSIUM: 4.5 mmol/L (ref 3.5–5.2)
SODIUM: 141 mmol/L (ref 134–144)
Total Protein: 7.2 g/dL (ref 6.0–8.5)

## 2017-03-04 LAB — CBC WITH DIFFERENTIAL/PLATELET
BASOS: 1 %
Basophils Absolute: 0 10*3/uL (ref 0.0–0.2)
EOS (ABSOLUTE): 0.1 10*3/uL (ref 0.0–0.4)
EOS: 2 %
HEMATOCRIT: 36.6 % (ref 34.0–46.6)
HEMOGLOBIN: 12.3 g/dL (ref 11.1–15.9)
Immature Grans (Abs): 0 10*3/uL (ref 0.0–0.1)
Immature Granulocytes: 0 %
LYMPHS ABS: 2.1 10*3/uL (ref 0.7–3.1)
Lymphs: 32 %
MCH: 29.6 pg (ref 26.6–33.0)
MCHC: 33.6 g/dL (ref 31.5–35.7)
MCV: 88 fL (ref 79–97)
MONOCYTES: 6 %
MONOS ABS: 0.4 10*3/uL (ref 0.1–0.9)
NEUTROS ABS: 3.8 10*3/uL (ref 1.4–7.0)
Neutrophils: 59 %
Platelets: 270 10*3/uL (ref 150–379)
RBC: 4.15 x10E6/uL (ref 3.77–5.28)
RDW: 13.8 % (ref 12.3–15.4)
WBC: 6.5 10*3/uL (ref 3.4–10.8)

## 2017-03-04 LAB — TSH: TSH: 1.14 u[IU]/mL (ref 0.450–4.500)

## 2017-03-04 LAB — LIPID PANEL
CHOL/HDL RATIO: 4.1 ratio (ref 0.0–4.4)
Cholesterol, Total: 244 mg/dL — ABNORMAL HIGH (ref 100–199)
HDL: 60 mg/dL (ref >39)
LDL CALC: 145 mg/dL — AB (ref 0–99)
TRIGLYCERIDES: 197 mg/dL — AB (ref 0–149)
VLDL Cholesterol Cal: 39 mg/dL (ref 5–40)

## 2017-03-16 ENCOUNTER — Other Ambulatory Visit: Payer: Self-pay | Admitting: Internal Medicine

## 2017-03-16 DIAGNOSIS — G43909 Migraine, unspecified, not intractable, without status migrainosus: Secondary | ICD-10-CM

## 2017-11-29 ENCOUNTER — Telehealth: Payer: Self-pay

## 2017-11-29 NOTE — Telephone Encounter (Signed)
ERROR

## 2017-11-29 NOTE — Telephone Encounter (Signed)
Patient requested refills on Nexium from pharmacy. She has not been seen since May of 2018. Needs to schedule a physical with Dr Army Melia for this year in May or next available CPE to continue for refills on medication. She has no follow ups scheduled.

## 2017-11-29 NOTE — Telephone Encounter (Signed)
Forgot to route first message. Resending and routing this one.  Patient requested refills on Nexium from pharmacy. She has not been seen since May of 2018. Needs to schedule a physical with Dr Army Melia for this year in May or next available CPE to continue for refills on medication. She has no follow ups scheduled.

## 2017-12-03 ENCOUNTER — Other Ambulatory Visit: Payer: Self-pay | Admitting: Internal Medicine

## 2017-12-03 NOTE — Telephone Encounter (Signed)
lvm to set up appt

## 2018-04-18 ENCOUNTER — Other Ambulatory Visit: Payer: Self-pay | Admitting: Internal Medicine

## 2018-04-18 DIAGNOSIS — F419 Anxiety disorder, unspecified: Secondary | ICD-10-CM

## 2018-04-21 ENCOUNTER — Other Ambulatory Visit: Payer: Self-pay | Admitting: Internal Medicine

## 2018-04-21 DIAGNOSIS — F419 Anxiety disorder, unspecified: Secondary | ICD-10-CM

## 2018-05-24 ENCOUNTER — Other Ambulatory Visit: Payer: Self-pay | Admitting: Internal Medicine

## 2018-05-25 ENCOUNTER — Other Ambulatory Visit: Payer: Self-pay | Admitting: Internal Medicine

## 2018-07-05 ENCOUNTER — Ambulatory Visit: Payer: BC Managed Care – PPO | Admitting: Internal Medicine

## 2018-07-05 ENCOUNTER — Other Ambulatory Visit (HOSPITAL_COMMUNITY)
Admission: RE | Admit: 2018-07-05 | Discharge: 2018-07-05 | Disposition: A | Discharge status: Home / Self Care | Payer: BC Managed Care – PPO | Source: Ambulatory Visit | Attending: Internal Medicine | Admitting: Internal Medicine

## 2018-07-05 ENCOUNTER — Encounter: Payer: Self-pay | Admitting: Internal Medicine

## 2018-07-05 VITALS — BP 114/70 | HR 72 | Ht 70.0 in | Wt 183.0 lb

## 2018-07-05 DIAGNOSIS — K219 Gastro-esophageal reflux disease without esophagitis: Secondary | ICD-10-CM | POA: Diagnosis not present

## 2018-07-05 DIAGNOSIS — E785 Hyperlipidemia, unspecified: Secondary | ICD-10-CM

## 2018-07-05 DIAGNOSIS — F419 Anxiety disorder, unspecified: Secondary | ICD-10-CM

## 2018-07-05 DIAGNOSIS — R928 Other abnormal and inconclusive findings on diagnostic imaging of breast: Secondary | ICD-10-CM | POA: Diagnosis not present

## 2018-07-05 DIAGNOSIS — Z0000 Encounter for general adult medical examination without abnormal findings: Secondary | ICD-10-CM | POA: Diagnosis not present

## 2018-07-05 DIAGNOSIS — D126 Benign neoplasm of colon, unspecified: Secondary | ICD-10-CM

## 2018-07-05 DIAGNOSIS — R002 Palpitations: Secondary | ICD-10-CM

## 2018-07-05 DIAGNOSIS — Z124 Encounter for screening for malignant neoplasm of cervix: Secondary | ICD-10-CM | POA: Diagnosis present

## 2018-07-05 LAB — POCT URINALYSIS DIPSTICK
Bilirubin, UA: NEGATIVE
Blood, UA: NEGATIVE
Glucose, UA: NEGATIVE
KETONES UA: NEGATIVE
LEUKOCYTES UA: NEGATIVE
NITRITE UA: NEGATIVE
PH UA: 6 (ref 5.0–8.0)
PROTEIN UA: NEGATIVE
SPEC GRAV UA: 1.015 (ref 1.010–1.025)
UROBILINOGEN UA: 0.2 U/dL

## 2018-07-05 MED ORDER — ALPRAZOLAM 0.25 MG PO TABS: ORAL_TABLET | ORAL | 0 refills | Status: DC

## 2018-07-05 NOTE — Patient Instructions (Signed)
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
## 2018-07-06 LAB — CBC WITH DIFFERENTIAL/PLATELET
BASOS ABS: 0.1 10*3/uL (ref 0.0–0.2)
Basos: 1 %
EOS (ABSOLUTE): 0.1 10*3/uL (ref 0.0–0.4)
Eos: 2 %
Hematocrit: 35.6 % (ref 34.0–46.6)
Hemoglobin: 12 g/dL (ref 11.1–15.9)
IMMATURE GRANS (ABS): 0 10*3/uL (ref 0.0–0.1)
IMMATURE GRANULOCYTES: 0 %
Lymphocytes Absolute: 2.3 10*3/uL (ref 0.7–3.1)
Lymphs: 42 %
MCH: 29.9 pg (ref 26.6–33.0)
MCHC: 33.7 g/dL (ref 31.5–35.7)
MCV: 89 fL (ref 79–97)
MONOCYTES: 6 %
Monocytes Absolute: 0.3 10*3/uL (ref 0.1–0.9)
NEUTROS PCT: 49 %
Neutrophils Absolute: 2.6 10*3/uL (ref 1.4–7.0)
PLATELETS: 287 10*3/uL (ref 150–450)
RBC: 4.02 x10E6/uL (ref 3.77–5.28)
RDW: 12.5 % (ref 12.3–15.4)
WBC: 5.4 10*3/uL (ref 3.4–10.8)

## 2018-07-06 LAB — COMPREHENSIVE METABOLIC PANEL
ALT: 17 IU/L (ref 0–32)
AST: 22 IU/L (ref 0–40)
Albumin/Globulin Ratio: 2.5 — ABNORMAL HIGH (ref 1.2–2.2)
Albumin: 5 g/dL (ref 3.5–5.5)
Alkaline Phosphatase: 72 IU/L (ref 39–117)
BUN/Creatinine Ratio: 10 (ref 9–23)
BUN: 8 mg/dL (ref 6–24)
Bilirubin Total: 0.3 mg/dL (ref 0.0–1.2)
CALCIUM: 10 mg/dL (ref 8.7–10.2)
CHLORIDE: 101 mmol/L (ref 96–106)
CO2: 25 mmol/L (ref 20–29)
CREATININE: 0.81 mg/dL (ref 0.57–1.00)
GFR calc non Af Amer: 82 mL/min/{1.73_m2} (ref >59)
GFR, EST AFRICAN AMERICAN: 95 mL/min/{1.73_m2} (ref >59)
GLUCOSE: 96 mg/dL (ref 65–99)
Globulin, Total: 2 g/dL (ref 1.5–4.5)
Potassium: 4.6 mmol/L (ref 3.5–5.2)
Sodium: 143 mmol/L (ref 134–144)
TOTAL PROTEIN: 7 g/dL (ref 6.0–8.5)

## 2018-07-06 LAB — LIPID PANEL
Chol/HDL Ratio: 3.8 ratio (ref 0.0–4.4)
Cholesterol, Total: 246 mg/dL — ABNORMAL HIGH (ref 100–199)
HDL: 64 mg/dL (ref >39)
LDL CALC: 147 mg/dL — AB (ref 0–99)
Triglycerides: 175 mg/dL — ABNORMAL HIGH (ref 0–149)
VLDL CHOLESTEROL CAL: 35 mg/dL (ref 5–40)

## 2018-07-06 LAB — TSH: TSH: 1.42 u[IU]/mL (ref 0.450–4.500)

## 2018-07-06 LAB — CYTOLOGY - PAP
Adequacy: ABSENT
DIAGNOSIS: NEGATIVE

## 2018-07-26 ENCOUNTER — Encounter: Payer: Self-pay | Admitting: *Deleted

## 2018-07-26 ENCOUNTER — Encounter: Payer: Self-pay | Admitting: Internal Medicine

## 2018-08-01 ENCOUNTER — Telehealth: Payer: Self-pay | Admitting: Gastroenterology

## 2018-08-01 NOTE — Telephone Encounter (Signed)
Patient called & would like to schedule her colonscopy.She is being referred by Dr Jonna Munro and has received a call & letter regarding this matter. Please call [phone])

## 2018-08-02 ENCOUNTER — Other Ambulatory Visit: Payer: Self-pay

## 2018-08-02 DIAGNOSIS — Z86010 Personal history of colonic polyps: Principal | ICD-10-CM

## 2018-08-02 DIAGNOSIS — Z1211 Encounter for screening for malignant neoplasm of colon: Secondary | ICD-10-CM

## 2018-08-02 MED ORDER — NA SULFATE-K SULFATE-MG SULF 17.5-3.13-1.6 GM/177ML PO SOLN: 1.0000 | Freq: Once | ORAL | 0 refills | Status: AC

## 2018-08-10 ENCOUNTER — Telehealth: Payer: Self-pay

## 2018-08-10 NOTE — Telephone Encounter (Signed)
Patient contacted office states that she has a dental emergency and will not be able to have her colonoscopy on 10/10 as scheduled.  I've advised her that there is a cancellation fee of $100 and I will discuss the circumstances of cancellation with office manager.  Her colonoscopy has been rescheduled from tomorrow to 09/05/18.  Referral updated. Trish in Endoscopy notified.  Thanks [employer]

## 2018-08-17 ENCOUNTER — Telehealth: Payer: Self-pay | Admitting: Internal Medicine

## 2018-08-17 NOTE — Telephone Encounter (Signed)
Tried calling patient about mammo results- Phone continued to ring. No VM. Will try again tomorrow.

## 2018-08-17 NOTE — Telephone Encounter (Signed)
Mammogram shows scattered densities in the left breast.  Recommend a repeat mammogram in 6 months.

## 2018-08-18 NOTE — Telephone Encounter (Signed)
2nd attempt- NO VM. Will try again tomorrow.

## 2018-08-19 NOTE — Telephone Encounter (Signed)
Called patient to discuss results. Phone keeps ringing. Unable to leave VM.

## 2018-09-03 ENCOUNTER — Encounter: Payer: Self-pay | Admitting: Anesthesiology

## 2018-09-05 ENCOUNTER — Ambulatory Visit: Payer: BC Managed Care – PPO | Admitting: Anesthesiology

## 2018-09-05 ENCOUNTER — Encounter
Admission: RE | Disposition: A | Discharge status: Home / Self Care | Payer: Self-pay | Source: Ambulatory Visit | Attending: Gastroenterology

## 2018-09-05 ENCOUNTER — Encounter: Payer: Self-pay | Admitting: Student

## 2018-09-05 ENCOUNTER — Ambulatory Visit
Admission: RE | Admit: 2018-09-05 | Discharge: 2018-09-05 | Disposition: A | Discharge status: Home / Self Care | Payer: BC Managed Care – PPO | Source: Ambulatory Visit | Attending: Gastroenterology | Admitting: Gastroenterology

## 2018-09-05 DIAGNOSIS — Z1211 Encounter for screening for malignant neoplasm of colon: Secondary | ICD-10-CM

## 2018-09-05 DIAGNOSIS — K219 Gastro-esophageal reflux disease without esophagitis: Secondary | ICD-10-CM | POA: Insufficient documentation

## 2018-09-05 DIAGNOSIS — M1990 Unspecified osteoarthritis, unspecified site: Secondary | ICD-10-CM | POA: Insufficient documentation

## 2018-09-05 DIAGNOSIS — Z79899 Other long term (current) drug therapy: Secondary | ICD-10-CM | POA: Insufficient documentation

## 2018-09-05 DIAGNOSIS — K6389 Other specified diseases of intestine: Secondary | ICD-10-CM | POA: Diagnosis not present

## 2018-09-05 DIAGNOSIS — D124 Benign neoplasm of descending colon: Secondary | ICD-10-CM

## 2018-09-05 DIAGNOSIS — Z7982 Long term (current) use of aspirin: Secondary | ICD-10-CM | POA: Insufficient documentation

## 2018-09-05 DIAGNOSIS — D122 Benign neoplasm of ascending colon: Secondary | ICD-10-CM | POA: Diagnosis not present

## 2018-09-05 DIAGNOSIS — K635 Polyp of colon: Secondary | ICD-10-CM | POA: Insufficient documentation

## 2018-09-05 DIAGNOSIS — Z86010 Personal history of colonic polyps: Secondary | ICD-10-CM | POA: Diagnosis not present

## 2018-09-05 DIAGNOSIS — D120 Benign neoplasm of cecum: Secondary | ICD-10-CM | POA: Insufficient documentation

## 2018-09-05 DIAGNOSIS — F419 Anxiety disorder, unspecified: Secondary | ICD-10-CM | POA: Diagnosis not present

## 2018-09-05 DIAGNOSIS — Z8249 Family history of ischemic heart disease and other diseases of the circulatory system: Secondary | ICD-10-CM | POA: Diagnosis not present

## 2018-09-05 DIAGNOSIS — Z880 Allergy status to penicillin: Secondary | ICD-10-CM | POA: Insufficient documentation

## 2018-09-05 DIAGNOSIS — Z860101 Personal history of adenomatous and serrated colon polyps: Secondary | ICD-10-CM

## 2018-09-05 HISTORY — PX: COLONOSCOPY WITH PROPOFOL: SHX5780

## 2018-09-05 HISTORY — DX: Unspecified osteoarthritis, unspecified site: M19.90

## 2018-09-05 HISTORY — DX: Anxiety disorder, unspecified: F41.9

## 2018-09-05 SURGERY — COLONOSCOPY WITH PROPOFOL
Anesthesia: General

## 2018-09-05 MED ORDER — PROPOFOL 10 MG/ML IV BOLUS
INTRAVENOUS | Status: DC | PRN
  Administered 2018-09-05 (×3): 30 mg via INTRAVENOUS

## 2018-09-05 MED ORDER — SODIUM CHLORIDE 0.9 % IV SOLN
INTRAVENOUS | Status: DC
  Administered 2018-09-05: 1000 mL via INTRAVENOUS

## 2018-09-05 MED ORDER — PROPOFOL 500 MG/50ML IV EMUL
INTRAVENOUS | Status: DC | PRN
  Administered 2018-09-05: 100 ug/kg/min via INTRAVENOUS

## 2018-09-05 NOTE — Op Note (Signed)
[facility] Patient Name: Erin Smith Procedure Date: 09/05/2018 7:17 AM MRN: [id number] Account #: [id number] Date of Birth: 1960/12/07 Admit Type: Outpatient Age: 57 Room: [facility] ENDO ROOM 2 Gender: Female Note Status: Finalized Procedure:            Colonoscopy Indications:          Surveillance: Personal history of adenomatous polyps on                        last colonoscopy 5 years ago Providers:            Lin Landsman MD, MD Referring MD:         Halina Maidens, MD (Referring MD) Medicines:            Monitored Anesthesia Care Complications:        No immediate complications. Estimated blood loss:                        Minimal. Procedure:            Pre-Anesthesia Assessment:                       - Prior to the procedure, a History and Physical was                        performed, and patient medications and allergies were                        reviewed. The patient is competent. The risks and                        benefits of the procedure and the sedation options and                        risks were discussed with the patient. All questions                        were answered and informed consent was obtained.                        Patient identification and proposed procedure were                        verified by the physician, the nurse, the                        anesthesiologist, the anesthetist and the technician in                        the pre-procedure area in the procedure room in the                        endoscopy suite. Mental Status Examination: alert and                        oriented. Airway Examination: normal oropharyngeal                        airway and neck mobility. Respiratory Examination:  clear to auscultation. CV Examination: normal.                        Prophylactic Antibiotics: The patient does not require                        prophylactic antibiotics. Prior  Anticoagulants: The                        patient has taken no previous anticoagulant or                        antiplatelet agents. ASA Grade Assessment: III - A                        patient with severe systemic disease. After reviewing                        the risks and benefits, the patient was deemed in                        satisfactory condition to undergo the procedure. The                        anesthesia plan was to use monitored anesthesia care                        (MAC). Immediately prior to administration of                        medications, the patient was re-assessed for adequacy                        to receive sedatives. The heart rate, respiratory rate,                        oxygen saturations, blood pressure, adequacy of                        pulmonary ventilation, and response to care were                        monitored throughout the procedure. The physical status                        of the patient was re-assessed after the procedure.                       After obtaining informed consent, the colonoscope was                        passed under direct vision. Throughout the procedure,                        the patient's blood pressure, pulse, and oxygen                        saturations were monitored continuously. The                        Colonoscope  was introduced through the anus and                        advanced to the the cecum, identified by appendiceal                        orifice and ileocecal valve. The colonoscopy was                        performed without difficulty. The patient tolerated the                        procedure well. The quality of the bowel preparation                        was evaluated using the BBPS [facility] Bowel Preparation                        Scale) with scores of: Right Colon = 3, Transverse                        Colon = 3 and Left Colon = 3 (entire mucosa seen well                        with no residual  staining, small fragments of stool or                        opaque liquid). The total BBPS score equals 9. Findings:      The perianal and digital rectal examinations were normal. Pertinent       negatives include normal sphincter tone and no palpable rectal lesions.      Five sessile polyps were found in the descending colon, ascending colon,       cecum and ileocecal valve. The polyps were 3 to 6 mm in size. These       polyps were removed with a cold snare. Resection and retrieval were       complete. To prevent bleeding after the polypectomy, one hemostatic clip       was successfully placed (MR conditional) on IC valve polyp. There was no       bleeding at the end of the procedure.      The retroflexed view of the distal rectum and anal verge was normal and       showed no anal or rectal abnormalities.      A diffuse area of mild melanosis was found in the entire colon. Impression:           - Five 3 to 6 mm polyps in the descending colon, in the                        ascending colon, in the cecum and at the ileocecal                        valve, removed with a cold snare. Resected and                        retrieved. Clip (MR conditional) was placed.                       -  The distal rectum and anal verge are normal on                        retroflexion view. Recommendation:       - Discharge patient to home (with escort).                       - Resume previous diet today.                       - Continue present medications.                       - Await pathology results.                       - Repeat colonoscopy in 3 years for surveillance of                        multiple polyps. Procedure Code(s):    --- Professional ---                       [phone], Colonoscopy, flexible; with removal of tumor(s),                        polyp(s), or other lesion(s) by snare technique Diagnosis Code(s):    --- Professional ---                       Z86.010, Personal history of colonic  polyps                       D12.4, Benign neoplasm of descending colon                       D12.2, Benign neoplasm of ascending colon                       D12.0, Benign neoplasm of cecum CPT copyright 2018 American Medical Association. All rights reserved. The codes documented in this report are preliminary and upon coder review may  be revised to meet current compliance requirements. Dr. Ulyess Mort Lin Landsman MD, MD 09/05/2018 8:52:31 AM This report has been signed electronically. Number of Addenda: 0 Note Initiated On: 09/05/2018 7:17 AM Scope Withdrawal Time: 0 hours 19 minutes 0 seconds  Total Procedure Duration: 0 hours 22 minutes 10 seconds       [facility]

## 2018-09-05 NOTE — Anesthesia Preprocedure Evaluation (Addendum)
Anesthesia Evaluation  Patient identified by MRN, date of birth, ID band Patient awake    Reviewed: Allergy & Precautions, NPO status , Patient's Chart, lab work & pertinent test results, reviewed documented beta blocker date and time   Airway Mallampati: II  TM Distance: >3 FB     Dental  (+) Chipped   Pulmonary Current Smoker, former smoker,           Cardiovascular      Neuro/Psych  Headaches, Anxiety    GI/Hepatic GERD  ,  Endo/Other    Renal/GU      Musculoskeletal   Abdominal   Peds  Hematology   Anesthesia Other Findings Takes B-blockers.  Reproductive/Obstetrics                            Anesthesia Physical Anesthesia Plan  ASA: III  Anesthesia Plan: General   Post-op Pain Management:    Induction: Intravenous  PONV Risk Score and Plan:   Airway Management Planned:   Additional Equipment:   Intra-op Plan:   Post-operative Plan:   Informed Consent: I have reviewed the patients History and Physical, chart, labs and discussed the procedure including the risks, benefits and alternatives for the proposed anesthesia with the patient or authorized representative who has indicated his/her understanding and acceptance.     Plan Discussed with: CRNA  Anesthesia Plan Comments:         Anesthesia Quick Evaluation

## 2018-09-05 NOTE — Transfer of Care (Signed)
Immediate Anesthesia Transfer of Care Note  Patient: Erin Smith  Procedure(s) Performed: COLONOSCOPY WITH PROPOFOL (N/A )  Patient Location: PACU  Anesthesia Type:General  Level of Consciousness: awake  Airway & Oxygen Therapy: Patient Spontanous Breathing  Post-op Assessment: Post -op Vital signs reviewed and stable  Post vital signs: stable  Last Vitals:  Vitals Value Taken Time  BP 91/60 09/05/2018  8:57 AM  Temp 36.1 C 09/05/2018  8:57 AM  Pulse 76 09/05/2018  8:58 AM  Resp 22 09/05/2018  8:58 AM  SpO2 100 % 09/05/2018  8:58 AM  Vitals shown include unvalidated device data.  Last Pain:  Vitals:   09/05/18 0857  TempSrc: Tympanic  PainSc: 0-No pain         Complications: No apparent anesthesia complications

## 2018-09-05 NOTE — Anesthesia Postprocedure Evaluation (Signed)
Anesthesia Post Note  Patient: Erin Smith  Procedure(s) Performed: COLONOSCOPY WITH PROPOFOL (N/A )  Patient location during evaluation: Endoscopy Anesthesia Type: General Level of consciousness: awake and alert Pain management: pain level controlled Vital Signs Assessment: post-procedure vital signs reviewed and stable Respiratory status: spontaneous breathing, nonlabored ventilation, respiratory function stable and patient connected to nasal cannula oxygen Cardiovascular status: blood pressure returned to baseline and stable Postop Assessment: no apparent nausea or vomiting Anesthetic complications: no     Last Vitals:  Vitals:   09/05/18 0917 09/05/18 0927  BP: 104/72 108/80  Pulse: 74 65  Resp: 15 20  Temp:    SpO2: 100% 100%    Last Pain:  Vitals:   09/05/18 0927  TempSrc:   PainSc: 0-No pain                 Ivyana Locey S

## 2018-09-05 NOTE — Anesthesia Post-op Follow-up Note (Deleted)
Anesthesia QCDR form completed.        
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
## 2018-09-05 NOTE — Anesthesia Post-op Follow-up Note (Signed)
Anesthesia QCDR form completed.        

## 2018-09-06 ENCOUNTER — Encounter: Payer: Self-pay | Admitting: Gastroenterology

## 2018-09-07 LAB — SURGICAL PATHOLOGY

## 2018-09-12 ENCOUNTER — Encounter: Payer: Self-pay | Admitting: Gastroenterology

## 2018-12-08 ENCOUNTER — Other Ambulatory Visit: Payer: Self-pay | Admitting: Internal Medicine

## 2019-06-05 ENCOUNTER — Other Ambulatory Visit: Payer: Self-pay | Admitting: Internal Medicine

## 2019-07-07 ENCOUNTER — Ambulatory Visit (INDEPENDENT_AMBULATORY_CARE_PROVIDER_SITE_OTHER): Payer: BC Managed Care – PPO | Admitting: Internal Medicine

## 2019-07-07 ENCOUNTER — Encounter: Payer: Self-pay | Admitting: Internal Medicine

## 2019-07-07 ENCOUNTER — Other Ambulatory Visit: Payer: Self-pay

## 2019-07-07 VITALS — BP 104/68 | HR 77 | Ht 70.0 in | Wt 190.0 lb

## 2019-07-07 DIAGNOSIS — G43909 Migraine, unspecified, not intractable, without status migrainosus: Secondary | ICD-10-CM | POA: Diagnosis not present

## 2019-07-07 DIAGNOSIS — E785 Hyperlipidemia, unspecified: Secondary | ICD-10-CM | POA: Diagnosis not present

## 2019-07-07 DIAGNOSIS — R002 Palpitations: Secondary | ICD-10-CM

## 2019-07-07 DIAGNOSIS — F419 Anxiety disorder, unspecified: Secondary | ICD-10-CM | POA: Diagnosis not present

## 2019-07-07 DIAGNOSIS — K219 Gastro-esophageal reflux disease without esophagitis: Secondary | ICD-10-CM

## 2019-07-07 DIAGNOSIS — Z0000 Encounter for general adult medical examination without abnormal findings: Secondary | ICD-10-CM | POA: Diagnosis not present

## 2019-07-07 LAB — POCT URINALYSIS DIPSTICK
Bilirubin, UA: NEGATIVE
Blood, UA: NEGATIVE
Glucose, UA: NEGATIVE
Ketones, UA: NEGATIVE
Leukocytes, UA: NEGATIVE
Nitrite, UA: NEGATIVE
Protein, UA: NEGATIVE
Spec Grav, UA: <=1.005 — AB (ref 1.010–1.025)
Urobilinogen, UA: 0.2 E.U./dL
pH, UA: 6 (ref 5.0–8.0)

## 2019-07-07 MED ORDER — ALPRAZOLAM 0.25 MG PO TABS: ORAL_TABLET | ORAL | 0 refills | Status: DC
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
## 2019-07-08 LAB — CBC WITH DIFFERENTIAL/PLATELET
Basophils Absolute: 0.1 10*3/uL (ref 0.0–0.2)
Basos: 1 %
EOS (ABSOLUTE): 0.1 10*3/uL (ref 0.0–0.4)
Eos: 2 %
Hematocrit: 37 % (ref 34.0–46.6)
Hemoglobin: 12.4 g/dL (ref 11.1–15.9)
Immature Grans (Abs): 0 10*3/uL (ref 0.0–0.1)
Immature Granulocytes: 0 %
Lymphocytes Absolute: 2.2 10*3/uL (ref 0.7–3.1)
Lymphs: 43 %
MCH: 30 pg (ref 26.6–33.0)
MCHC: 33.5 g/dL (ref 31.5–35.7)
MCV: 89 fL (ref 79–97)
Monocytes Absolute: 0.3 10*3/uL (ref 0.1–0.9)
Monocytes: 6 %
Neutrophils Absolute: 2.3 10*3/uL (ref 1.4–7.0)
Neutrophils: 48 %
Platelets: 294 10*3/uL (ref 150–450)
RBC: 4.14 x10E6/uL (ref 3.77–5.28)
RDW: 12.7 % (ref 11.7–15.4)
WBC: 5 10*3/uL (ref 3.4–10.8)

## 2019-07-08 LAB — COMPREHENSIVE METABOLIC PANEL
ALT: 26 IU/L (ref 0–32)
AST: 24 IU/L (ref 0–40)
Albumin/Globulin Ratio: 2.2 (ref 1.2–2.2)
Albumin: 4.9 g/dL (ref 3.8–4.9)
Alkaline Phosphatase: 70 IU/L (ref 39–117)
BUN/Creatinine Ratio: 11 (ref 9–23)
BUN: 8 mg/dL (ref 6–24)
Bilirubin Total: 0.3 mg/dL (ref 0.0–1.2)
CO2: 24 mmol/L (ref 20–29)
Calcium: 10 mg/dL (ref 8.7–10.2)
Chloride: 101 mmol/L (ref 96–106)
Creatinine, Ser: 0.76 mg/dL (ref 0.57–1.00)
GFR calc Af Amer: 101 mL/min/{1.73_m2} (ref >59)
GFR calc non Af Amer: 88 mL/min/{1.73_m2} (ref >59)
Globulin, Total: 2.2 g/dL (ref 1.5–4.5)
Glucose: 107 mg/dL — ABNORMAL HIGH (ref 65–99)
Potassium: 4.4 mmol/L (ref 3.5–5.2)
Sodium: 140 mmol/L (ref 134–144)
Total Protein: 7.1 g/dL (ref 6.0–8.5)

## 2019-07-08 LAB — LIPID PANEL
Chol/HDL Ratio: 3.5 ratio (ref 0.0–4.4)
Cholesterol, Total: 241 mg/dL — ABNORMAL HIGH (ref 100–199)
HDL: 68 mg/dL (ref >39)
LDL Chol Calc (NIH): 143 mg/dL — ABNORMAL HIGH (ref 0–99)
Triglycerides: 169 mg/dL — ABNORMAL HIGH (ref 0–149)
VLDL Cholesterol Cal: 30 mg/dL (ref 5–40)

## 2019-07-08 LAB — TSH+FREE T4
Free T4: 1.45 ng/dL (ref 0.82–1.77)
TSH: 1.55 u[IU]/mL (ref 0.450–4.500)

## 2019-09-02 ENCOUNTER — Other Ambulatory Visit: Payer: Self-pay | Admitting: Internal Medicine

## 2019-11-29 ENCOUNTER — Other Ambulatory Visit: Payer: Self-pay | Admitting: Internal Medicine

## 2019-12-12 DIAGNOSIS — M5416 Radiculopathy, lumbar region: Secondary | ICD-10-CM | POA: Insufficient documentation

## 2020-05-09 ENCOUNTER — Telehealth: Payer: Self-pay | Admitting: Internal Medicine

## 2020-05-09 NOTE — Telephone Encounter (Signed)
Copied from [place] [id number]. Topic: General - Inquiry >> May 09, 2020  2:56 PM Mathis Bud wrote: Reason for CRM: patient is requesting PCP to send [facility] a referral for a mammogram.  Patient wanted to add to please add "3B" on referral.   Call back [phone]

## 2020-05-10 NOTE — Telephone Encounter (Signed)
Called pt left VM. Pts name is on voicemail. Told her that we could not send in a referral for a 3D breast exam that her last mammogram said that she needed to be seen for a screening mammogram. That we can fax a screening mammogram in and if it comes back that she needs further testing we can try to get a diagnostic test.  KP

## 2020-05-10 NOTE — Telephone Encounter (Signed)
Pt returning call stating that she called [facility] yesterday and that they told her that because the mammogram was abnormal before she would need a diagnostic and not a screening. Please advise .

## 2020-05-10 NOTE — Telephone Encounter (Signed)
Called pt left her know that I faxed [facility] with a diagnostic mammogram. Pt verbalized understanding.   KP

## 2020-07-10 ENCOUNTER — Ambulatory Visit (INDEPENDENT_AMBULATORY_CARE_PROVIDER_SITE_OTHER): Payer: BC Managed Care – PPO | Admitting: Internal Medicine

## 2020-07-10 ENCOUNTER — Encounter: Payer: Self-pay | Admitting: Internal Medicine

## 2020-07-10 ENCOUNTER — Other Ambulatory Visit: Payer: Self-pay

## 2020-07-10 VITALS — BP 104/76 | HR 77 | Ht 70.0 in | Wt 189.0 lb

## 2020-07-10 DIAGNOSIS — Z0000 Encounter for general adult medical examination without abnormal findings: Secondary | ICD-10-CM

## 2020-07-10 DIAGNOSIS — F419 Anxiety disorder, unspecified: Secondary | ICD-10-CM

## 2020-07-10 DIAGNOSIS — D485 Neoplasm of uncertain behavior of skin: Secondary | ICD-10-CM

## 2020-07-10 DIAGNOSIS — G43909 Migraine, unspecified, not intractable, without status migrainosus: Secondary | ICD-10-CM

## 2020-07-10 DIAGNOSIS — K219 Gastro-esophageal reflux disease without esophagitis: Secondary | ICD-10-CM

## 2020-07-10 DIAGNOSIS — E785 Hyperlipidemia, unspecified: Secondary | ICD-10-CM | POA: Diagnosis not present

## 2020-07-10 LAB — POCT URINALYSIS DIPSTICK
Bilirubin, UA: NEGATIVE
Blood, UA: NEGATIVE
Glucose, UA: NEGATIVE
Ketones, UA: NEGATIVE
Leukocytes, UA: NEGATIVE
Nitrite, UA: NEGATIVE
Protein, UA: NEGATIVE
Spec Grav, UA: <=1.005 — AB (ref 1.010–1.025)
Urobilinogen, UA: 0.2 E.U./dL
pH, UA: 6 (ref 5.0–8.0)

## 2020-07-10 MED ORDER — ALPRAZOLAM 0.25 MG PO TABS: ORAL_TABLET | ORAL | 0 refills | Status: DC

## 2020-07-10 MED ORDER — PROPRANOLOL HCL 20 MG PO TABS: 20.0000 mg | ORAL_TABLET | Freq: Two times a day (BID) | ORAL | 3 refills | Status: DC
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
## 2020-07-11 LAB — COMPREHENSIVE METABOLIC PANEL
ALT: 30 IU/L (ref 0–32)
AST: 29 IU/L (ref 0–40)
Albumin/Globulin Ratio: 2 (ref 1.2–2.2)
Albumin: 5 g/dL — ABNORMAL HIGH (ref 3.8–4.9)
Alkaline Phosphatase: 77 IU/L (ref 48–121)
BUN/Creatinine Ratio: 9 (ref 9–23)
BUN: 7 mg/dL (ref 6–24)
Bilirubin Total: 0.2 mg/dL (ref 0.0–1.2)
CO2: 23 mmol/L (ref 20–29)
Calcium: 9.9 mg/dL (ref 8.7–10.2)
Chloride: 102 mmol/L (ref 96–106)
Creatinine, Ser: 0.77 mg/dL (ref 0.57–1.00)
GFR calc Af Amer: 99 mL/min/{1.73_m2} (ref >59)
GFR calc non Af Amer: 86 mL/min/{1.73_m2} (ref >59)
Globulin, Total: 2.5 g/dL (ref 1.5–4.5)
Glucose: 106 mg/dL — ABNORMAL HIGH (ref 65–99)
Potassium: 4.4 mmol/L (ref 3.5–5.2)
Sodium: 141 mmol/L (ref 134–144)
Total Protein: 7.5 g/dL (ref 6.0–8.5)

## 2020-07-11 LAB — CBC WITH DIFFERENTIAL/PLATELET
Basophils Absolute: 0.1 10*3/uL (ref 0.0–0.2)
Basos: 1 %
EOS (ABSOLUTE): 0.1 10*3/uL (ref 0.0–0.4)
Eos: 2 %
Hematocrit: 38 % (ref 34.0–46.6)
Hemoglobin: 12.5 g/dL (ref 11.1–15.9)
Immature Grans (Abs): 0 10*3/uL (ref 0.0–0.1)
Immature Granulocytes: 0 %
Lymphocytes Absolute: 2 10*3/uL (ref 0.7–3.1)
Lymphs: 40 %
MCH: 29.1 pg (ref 26.6–33.0)
MCHC: 32.9 g/dL (ref 31.5–35.7)
MCV: 88 fL (ref 79–97)
Monocytes Absolute: 0.3 10*3/uL (ref 0.1–0.9)
Monocytes: 6 %
Neutrophils Absolute: 2.6 10*3/uL (ref 1.4–7.0)
Neutrophils: 51 %
Platelets: 222 10*3/uL (ref 150–450)
RBC: 4.3 x10E6/uL (ref 3.77–5.28)
RDW: 12.4 % (ref 11.7–15.4)
WBC: 5.1 10*3/uL (ref 3.4–10.8)

## 2020-07-11 LAB — LIPID PANEL
Chol/HDL Ratio: 4.1 ratio (ref 0.0–4.4)
Cholesterol, Total: 253 mg/dL — ABNORMAL HIGH (ref 100–199)
HDL: 62 mg/dL (ref >39)
LDL Chol Calc (NIH): 161 mg/dL — ABNORMAL HIGH (ref 0–99)
Triglycerides: 166 mg/dL — ABNORMAL HIGH (ref 0–149)
VLDL Cholesterol Cal: 30 mg/dL (ref 5–40)

## 2020-07-11 LAB — TSH: TSH: 1.07 u[IU]/mL (ref 0.450–4.500)

## 2020-11-13 ENCOUNTER — Ambulatory Visit: Payer: BC Managed Care – PPO | Admitting: Dermatology

## 2020-11-13 ENCOUNTER — Other Ambulatory Visit: Payer: Self-pay

## 2020-11-13 DIAGNOSIS — D485 Neoplasm of uncertain behavior of skin: Secondary | ICD-10-CM

## 2020-11-13 DIAGNOSIS — L570 Actinic keratosis: Secondary | ICD-10-CM | POA: Diagnosis not present

## 2020-11-13 DIAGNOSIS — Z808 Family history of malignant neoplasm of other organs or systems: Secondary | ICD-10-CM | POA: Diagnosis not present

## 2020-11-13 DIAGNOSIS — L578 Other skin changes due to chronic exposure to nonionizing radiation: Secondary | ICD-10-CM | POA: Diagnosis not present

## 2020-11-13 DIAGNOSIS — D235 Other benign neoplasm of skin of trunk: Secondary | ICD-10-CM | POA: Diagnosis not present

## 2020-11-13 DIAGNOSIS — C44519 Basal cell carcinoma of skin of other part of trunk: Secondary | ICD-10-CM

## 2020-11-13 DIAGNOSIS — D239 Other benign neoplasm of skin, unspecified: Secondary | ICD-10-CM

## 2020-11-13 NOTE — Progress Notes (Signed)
   New Patient Visit  Subjective  Erin Smith is a 60 y.o. female who presents for the following: Skin Problem (New patient here today to have spot evaluated at chest, present for > 5 years. Patient advises she does have occasional itching and tenderness at area. It will bleed if patient scratches. No personal history of skin cancer, patient's father with history of BCC. ).  The following portions of the chart were reviewed this encounter and updated as appropriate:   Tobacco  Allergies  Meds  Problems  Med Hx  Surg Hx  Fam Hx      Review of Systems:  No other skin or systemic complaints except as noted in HPI or Assessment and Plan.  Objective  Well appearing patient in no apparent distress; mood and affect are within normal limits.  A focused examination was performed including face, chest, arms. Relevant physical exam findings are noted in the Assessment and Plan.  Objective  mid chest: 1.3cm pink plaque        Objective  Left upper arm: Erythematous thin papules/macules with gritty scale.   Objective  Mid chest: Firm pink/brown papulenodule with dimple sign.    Assessment & Plan  Neoplasm of uncertain behavior of skin mid chest  Skin / nail biopsy Type of biopsy: tangential   Informed consent: discussed and consent obtained   Timeout: patient name, date of birth, surgical site, and procedure verified   Patient was prepped and draped in usual sterile fashion: Area prepped with isopropyl alcohol. Anesthesia: the lesion was anesthetized in a standard fashion   Anesthetic:  1% lidocaine w/ epinephrine 1-100,000 buffered w/ 8.4% NaHCO3 Instrument used: flexible razor blade   Hemostasis achieved with: aluminum chloride   Outcome: patient tolerated procedure well   Post-procedure details: wound care instructions given   Additional details:  Mupirocin and a bandage applied  Specimen 1 - Surgical pathology Differential Diagnosis: r/o BCC  Check  Margins: No 1.3cm pink plaque  AK (actinic keratosis) Left upper arm  Prior to procedure, discussed risks of blister formation, small wound, skin dyspigmentation, or rare scar following cryotherapy.    Destruction of lesion - Left upper arm Complexity: simple   Destruction method: cryotherapy   Informed consent: discussed and consent obtained   Lesion destroyed using liquid nitrogen: Yes   Cryotherapy cycles:  2 Outcome: patient tolerated procedure well with no complications   Post-procedure details: wound care instructions given    Dermatofibroma Mid chest  Benign-appearing.  Observation.  Call clinic for new or changing lesions.    Actinic Damage - chronic, secondary to cumulative UV radiation exposure/sun exposure over time - diffuse scaly erythematous macules with underlying dyspigmentation - Recommend daily broad spectrum sunscreen SPF 30+ to sun-exposed areas, reapply every 2 hours as needed.  - Call for new or changing lesions.  Return for TBSE.  Graciella Belton, RMA, am acting as scribe for Forest Gleason, MD .  Documentation: I have reviewed the above documentation for accuracy and completeness, and I agree with the above.  Forest Gleason, MD

## 2020-11-13 NOTE — Patient Instructions (Addendum)
Melanoma ABCDEs  Melanoma is the most dangerous type of skin cancer, and is the leading cause of death from skin disease.  You are more likely to develop melanoma if you:  Have light-colored skin, light-colored eyes, or red or blond hair  Spend a lot of time in the sun  Tan regularly, either outdoors or in a tanning bed  Have had blistering sunburns, especially during childhood  Have a close family member who has had a melanoma  Have atypical moles or large birthmarks  Early detection of melanoma is key since treatment is typically straightforward and cure rates are extremely high if we catch it early.   The first sign of melanoma is often a change in a mole or a new dark spot.  The ABCDE system is a way of remembering the signs of melanoma.  A for asymmetry:  The two halves do not match. B for border:  The edges of the growth are irregular. C for color:  A mixture of colors are present instead of an even brown color. D for diameter:  Melanomas are usually (but not always) greater than 54mm - the size of a pencil eraser. E for evolution:  The spot keeps changing in size, shape, and color.  Please check your skin once per month between visits. You can use a small mirror in front and a large mirror behind you to keep an eye on the back side or your body.   If you see any new or changing lesions before your next follow-up, please call to schedule a visit.  Please continue daily skin protection including broad spectrum sunscreen SPF 30+ to sun-exposed areas, reapplying every 2 hours as needed when you're outdoors.    Wound Care Instructions  1. Cleanse wound gently with soap and water once a day then pat dry with clean gauze. Apply a thing coat of Petrolatum (petroleum jelly, "Vaseline") over the wound (unless you have an allergy to this). We recommend that you use a new, sterile tube of Vaseline. Do not pick or remove scabs. Do not remove the yellow or white "healing tissue" from the  base of the wound.  2. Cover the wound with fresh, clean, nonstick gauze and secure with paper tape. You may use Band-Aids in place of gauze and tape if the would is small enough, but would recommend trimming much of the tape off as there is often too much. Sometimes Band-Aids can irritate the skin.  3. You should call the office for your biopsy report after 1 week if you have not already been contacted.  4. If you experience any problems, such as abnormal amounts of bleeding, swelling, significant bruising, significant pain, or evidence of infection, please call the office immediately.   Recommend taking Heliocare sun protection supplement daily in sunny weather for additional sun protection. For maximum protection on the sunniest days, you can take up to 2 capsules of regular Heliocare OR take 1 capsule of Heliocare Ultra. For prolonged exposure (such as a full day in the sun), you can repeat your dose of the supplement 4 hours after your first dose. Heliocare can be purchased at [facility] or at [email or web address].    Cryotherapy Aftercare  . Wash gently with soap and water everyday.   Marland Kitchen Apply Vaseline and Band-Aid daily until healed.  Prior to procedure, discussed risks of blister formation, small wound, skin dyspigmentation, or rare scar following cryotherapy.

## 2020-11-15 ENCOUNTER — Other Ambulatory Visit: Payer: Self-pay | Admitting: Internal Medicine

## 2020-11-20 ENCOUNTER — Telehealth: Payer: Self-pay | Admitting: Dermatology

## 2020-11-20 NOTE — Progress Notes (Signed)
Skin , mid chest BASAL CELL CARCINOMA, SUPERFICIAL AND NODULAR PATTERNS --> ED&C > excision.   Called patient 11/20/2020 at 1:35 PM, no answer, left voicemail.  MAs ok to give results. Let me know when pt calls back and I can discuss the two options with her

## 2020-11-20 NOTE — Telephone Encounter (Signed)
Called patient back. No answer, left voicemail.  We will try back again tomorrow.

## 2020-11-21 ENCOUNTER — Telehealth: Payer: Self-pay | Admitting: Dermatology

## 2020-11-21 NOTE — Telephone Encounter (Signed)
Please call patient and schedule for ED&C. Thank you!

## 2020-11-27 ENCOUNTER — Encounter: Payer: Self-pay | Admitting: Dermatology

## 2020-12-03 ENCOUNTER — Ambulatory Visit: Payer: BC Managed Care – PPO | Admitting: Dermatology

## 2020-12-03 ENCOUNTER — Other Ambulatory Visit: Payer: Self-pay

## 2020-12-03 ENCOUNTER — Encounter: Payer: Self-pay | Admitting: Dermatology

## 2020-12-03 DIAGNOSIS — C44519 Basal cell carcinoma of skin of other part of trunk: Secondary | ICD-10-CM | POA: Diagnosis not present

## 2020-12-03 DIAGNOSIS — C4491 Basal cell carcinoma of skin, unspecified: Secondary | ICD-10-CM

## 2020-12-03 HISTORY — DX: Basal cell carcinoma of skin, unspecified: C44.91

## 2020-12-03 MED ORDER — MUPIROCIN 2 % EX OINT: TOPICAL_OINTMENT | CUTANEOUS | 0 refills | Status: DC

## 2020-12-03 NOTE — Progress Notes (Signed)
   Follow-Up Visit   Subjective  Erin Smith is a 60 y.o. female who presents for the following: Basal Cell Carcinoma (Of the mid chest, bx proven - patient is here today for treatment ).  The following portions of the chart were reviewed this encounter and updated as appropriate:   Tobacco  Allergies  Meds  Problems  Med Hx  Surg Hx  Fam Hx     Review of Systems:  No other skin or systemic complaints except as noted in HPI or Assessment and Plan.  Objective  Well appearing patient in no apparent distress; mood and affect are within normal limits.  A focused examination was performed including the chest . Relevant physical exam findings are noted in the Assessment and Plan.  Objective  Mid chest: Healing biopsy site  Assessment & Plan  Basal cell carcinoma (BCC) of skin of other part of torso Mid chest  Destruction of lesion Complexity: extensive   Destruction method: electrodesiccation and curettage   Informed consent: discussed and consent obtained   Timeout:  patient name, date of birth, surgical site, and procedure verified Procedure prep:  Patient was prepped and draped in usual sterile fashion Prep type:  Isopropyl alcohol Anesthesia: the lesion was anesthetized in a standard fashion   Anesthetic:  1% lidocaine w/ epinephrine 1-100,000 buffered w/ 8.4% NaHCO3 Curettage performed in three different directions: Yes   Electrodesiccation performed over the curetted area: Yes   Final wound size (cm):  2.6 Hemostasis achieved with:  pressure, aluminum chloride and electrodesiccation Outcome: patient tolerated procedure well with no complications   Post-procedure details: sterile dressing applied and wound care instructions given   Dressing type: bandage and petrolatum    mupirocin ointment (BACTROBAN) 2 %  Start Mupirocin 2% ointment to aa QD during wound dressing change.   Return for appointment as scheduled.  Luther Redo, CMA, am acting as scribe  for Forest Gleason, MD .  Documentation: I have reviewed the above documentation for accuracy and completeness, and I agree with the above.  Forest Gleason, MD

## 2020-12-03 NOTE — Patient Instructions (Signed)

## 2021-01-14 ENCOUNTER — Other Ambulatory Visit: Payer: Self-pay

## 2021-01-14 ENCOUNTER — Ambulatory Visit: Payer: BC Managed Care – PPO | Admitting: Dermatology

## 2021-01-14 ENCOUNTER — Encounter: Payer: Self-pay | Admitting: Dermatology

## 2021-01-14 DIAGNOSIS — Z1283 Encounter for screening for malignant neoplasm of skin: Secondary | ICD-10-CM

## 2021-01-14 DIAGNOSIS — D1800 Hemangioma unspecified site: Secondary | ICD-10-CM

## 2021-01-14 DIAGNOSIS — L814 Other melanin hyperpigmentation: Secondary | ICD-10-CM

## 2021-01-14 DIAGNOSIS — Z85828 Personal history of other malignant neoplasm of skin: Secondary | ICD-10-CM | POA: Diagnosis not present

## 2021-01-14 DIAGNOSIS — L578 Other skin changes due to chronic exposure to nonionizing radiation: Secondary | ICD-10-CM

## 2021-01-14 DIAGNOSIS — L821 Other seborrheic keratosis: Secondary | ICD-10-CM

## 2021-01-14 DIAGNOSIS — L910 Hypertrophic scar: Secondary | ICD-10-CM | POA: Diagnosis not present

## 2021-01-14 DIAGNOSIS — D229 Melanocytic nevi, unspecified: Secondary | ICD-10-CM

## 2021-01-14 MED ORDER — FLUOROURACIL 5 % EX CREA: TOPICAL_CREAM | Freq: Two times a day (BID) | CUTANEOUS | 1 refills | Status: DC

## 2021-01-14 NOTE — Progress Notes (Signed)
Follow-Up Visit   Subjective  Erin Smith is a 60 y.o. female who presents for the following: tbse (Patient here today for tbse. She has history of bcc on upper mid chest. ED & C performed on 2/1. Patient is concerned that there is some residual left. ).  The following portions of the chart were reviewed this encounter and updated as appropriate:  Tobacco  Allergies  Meds  Problems  Med Hx  Surg Hx  Fam Hx        Objective  Well appearing patient in no apparent distress; mood and affect are within normal limits.  A full examination was performed including scalp, head, eyes, ears, nose, lips, neck, chest, axillae, abdomen, back, buttocks, bilateral upper extremities, bilateral lower extremities, hands, feet, fingers, toes, fingernails, and toenails. All findings within normal limits unless otherwise noted below.  Objective  mid chest: Thickened Dyspigmented smooth plaque  Assessment & Plan  Hypertrophic scar mid chest  Benign appearing. Reassured.  Given symptomatic, treated with ILK today.  Recommend Serica moisturizing scar formula cream every night or [employer] brand or Mederma silicone scar sheet every night for the first year after a scar appears to help with scar remodeling if desired. Scars remodel on their own for a full year.   Intralesional injection - mid chest Location: mid chest   Informed Consent: Discussed risks (infection, pain, bleeding, bruising, thinning of the skin, loss of skin pigment, lack of resolution, and recurrence of lesion) and benefits of the procedure, as well as the alternatives. Informed consent was obtained. Preparation: The area was prepared a standard fashion.  Procedure Details: An intralesional injection was performed with Kenalog 5 mg/cc. 0.15 cc in total were injected.  Total number of injections: 6  Plan: The patient was instructed on post-op care. Recommend OTC analgesia as needed for pain.      Lentigines Bilateral legs  - Scattered tan macules - Due to sun exposure - Benign-appering, observe - Recommend daily broad spectrum sunscreen SPF 30+ to sun-exposed areas, reapply every 2 hours as needed. - Call for any changes  Seborrheic Keratoses - Stuck-on, waxy, tan-brown papules and plaques  - Discussed benign etiology and prognosis. - Observe - Call for any changes  Melanocytic Nevi - Tan-brown and/or pink-flesh-colored symmetric macules and papules - Benign appearing on exam today - Observation - Call clinic for new or changing moles - Recommend daily use of broad spectrum spf 30+ sunscreen to sun-exposed areas.   Hemangiomas - Red papules - Discussed benign nature - Observe - Call for any changes  Actinic Damage Severe at right cheek, not at goal - Chronic, secondary to cumulative UV/sun exposure - diffuse scaly erythematous macules with underlying dyspigmentation - Recommend daily broad spectrum sunscreen SPF 30+ to sun-exposed areas, reapply every 2 hours as needed.  - Call for new or changing lesions. - Start 5-fluorouracil/calcipotriene cream twice a day for 4 days to affected areas including right cheek. Prescription sent to [facility]. Patient advised they will receive an email to purchase the medication online and have it sent to their home. Patient provided with handout reviewing treatment course and side effects and advised to call or message [place] on MyChart with any concerns. Ordered Medications: fluorouracil (EFUDEX) 5 % cream  History of Basal Cell Carcinoma of the Skin - No evidence of recurrence today at mid chest  - Recommend regular full body skin exams - Recommend daily broad spectrum sunscreen SPF 30+ to sun-exposed areas, reapply every 2 hours  as needed.  - Call if any new or changing lesions are noted between office visits   Skin cancer screening performed today.  Return in about 2 months (around 03/16/2021) for actinic damage and  hypertrophic scar; 6 months FBSE.  I, Ruthell Rummage, CMA, am acting as scribe for Forest Gleason, MD.  Documentation: I have reviewed the above documentation for accuracy and completeness, and I agree with the above.  Forest Gleason, MD

## 2021-01-14 NOTE — Patient Instructions (Addendum)
Melanoma ABCDEs  Melanoma is the most dangerous type of skin cancer, and is the leading cause of death from skin disease.  You are more likely to develop melanoma if you:  Have light-colored skin, light-colored eyes, or red or blond hair  Spend a lot of time in the sun  Tan regularly, either outdoors or in a tanning bed  Have had blistering sunburns, especially during childhood  Have a close family member who has had a melanoma  Have atypical moles or large birthmarks  Early detection of melanoma is key since treatment is typically straightforward and cure rates are extremely high if we catch it early.   The first sign of melanoma is often a change in a mole or a new dark spot.  The ABCDE system is a way of remembering the signs of melanoma.  A for asymmetry:  The two halves do not match. B for border:  The edges of the growth are irregular. C for color:  A mixture of colors are present instead of an even brown color. D for diameter:  Melanomas are usually (but not always) greater than 24mm - the size of a pencil eraser. E for evolution:  The spot keeps changing in size, shape, and color.  Please check your skin once per month between visits. You can use a small mirror in front and a large mirror behind you to keep an eye on the back side or your body.   If you see any new or changing lesions before your next follow-up, please call to schedule a visit.  Please continue daily skin protection including broad spectrum sunscreen SPF 30+ to sun-exposed areas, reapplying every 2 hours as needed when you're outdoors.   Staying in the shade or wearing long sleeves, sun glasses (UVA+UVB protection) and wide brim hats (4-inch brim around the entire circumference of the hat) are also recommended for sun protection.   Recommend Niacinamide (Nicotinamide) 500mg  twice per day to lower risk of non-melanoma skin cancer by approximately 25%.   Recommend taking Heliocare sun protection supplement  daily in sunny weather for additional sun protection. For maximum protection on the sunniest days, you can take up to 2 capsules of regular Heliocare OR take 1 capsule of Heliocare Ultra. For prolonged exposure (such as a full day in the sun), you can repeat your dose of the supplement 4 hours after your first dose. Heliocare can be purchased at [facility] or at [email or web address].   For scar  Recommend Serica moisturizing scar formula cream every night or [employer] brand or Mederma silicone scar sheet every night for the first year after a scar appears to help with scar remodeling if desired. Scars remodel on their own for a full year.    5-Fluorouracil/Calcipotriene Patient Education   Actinic keratoses are the dry, red scaly spots on the skin caused by sun damage. A portion of these spots can turn into skin cancer with time, and treating them can help prevent development of skin cancer.   Treatment of these spots requires removal of the defective skin cells. There are various ways to remove actinic keratoses, including freezing with liquid nitrogen, treatment with creams, or treatment with a blue light procedure in the office.   5-fluorouracil cream is a topical cream used to treat actinic keratoses. It works by interfering with the growth of abnormal fast-growing skin cells, such as actinic keratoses. These cells peel off and are replaced by healthy ones.   5-fluorouracil/calcipotriene is a combination of the  5-fluorouracil cream with a vitamin D analog cream called calcipotriene. The calcipotriene alone does not treat actinic keratoses. However, when it is combined with 5-fluorouracil, it helps the 5-fluorouracil treat the actinic keratoses much faster so that the same results can be achieved with a much shorter treatment time.  INSTRUCTIONS FOR 5-FLUOROURACIL/CALCIPOTRIENE CREAM:   5-fluorouracil/calcipotriene cream typically only needs to be used for 4-7 days. A thin layer should  be applied twice a day to the treatment areas recommended by your physician.   If your physician prescribed you separate tubes of 5-fluourouracil and calcipotriene, apply a thin layer of 5-fluorouracil followed by a thin layer of calcipotriene.   Avoid contact with your eyes, nostrils, and mouth. Do not use 5-fluorouracil/calcipotriene cream on infected or open wounds.   You will develop redness, irritation and some crusting at areas where you have pre-cancer damage/actinic keratoses. IF YOU DEVELOP PAIN, BLEEDING, OR SIGNIFICANT CRUSTING, STOP THE TREATMENT EARLY - you have already gotten a good response and the actinic keratoses should clear up well.  Wash your hands after applying 5-fluorouracil 5% cream on your skin.   A moisturizer or sunscreen with a minimum SPF 30 should be applied each morning.   Once you have finished the treatment, you can apply a thin layer of Vaseline twice a day to irritated areas to soothe and calm the areas more quickly. If you experience significant discomfort, contact your physician.  For some patients it is necessary to repeat the treatment for best results.  SIDE EFFECTS: When using 5-fluorouracil/calcipotriene cream, you may have mild irritation, such as redness, dryness, swelling, or a mild burning sensation. This usually resolves within 2 weeks. The more actinic keratoses you have, the more redness and inflammation you can expect during treatment. Eye irritation has been reported rarely. If this occurs, please let us know.  If you have any trouble using this cream, please call the office. If you have any other questions about this information, please do not hesitate to ask me before you leave the office.   Start 5-fluorouracil/calcipotriene cream twice a day for 4 days to affected areas including right cheek. Prescription sent to [facility]. Patient advised they will receive an email to purchase the medication online and have it sent to their home.  Patient provided with handout reviewing treatment course and side effects and advised to call or message [place] on MyChart with any concerns.

## 2021-03-20 ENCOUNTER — Ambulatory Visit: Payer: BC Managed Care – PPO | Admitting: Dermatology

## 2021-03-20 ENCOUNTER — Other Ambulatory Visit: Payer: Self-pay

## 2021-03-20 ENCOUNTER — Encounter: Payer: Self-pay | Admitting: Dermatology

## 2021-03-20 DIAGNOSIS — L570 Actinic keratosis: Secondary | ICD-10-CM | POA: Diagnosis not present

## 2021-03-20 DIAGNOSIS — L910 Hypertrophic scar: Secondary | ICD-10-CM

## 2021-03-20 NOTE — Patient Instructions (Signed)
Recommend taking Heliocare sun protection supplement daily in sunny weather for additional sun protection. For maximum protection on the sunniest days, you can take up to 2 capsules of regular Heliocare OR take 1 capsule of Heliocare Ultra. For prolonged exposure (such as a full day in the sun), you can repeat your dose of the supplement 4 hours after your first dose. Heliocare can be purchased at [facility] or at [email or web address].   Recommend daily broad spectrum sunscreen SPF 30+ to sun-exposed areas, reapply every 2 hours as needed. Call for new or changing lesions.  Staying in the shade or wearing long sleeves, sun glasses (UVA+UVB protection) and wide brim hats (4-inch brim around the entire circumference of the hat) are also recommended for sun protection.   If you have any questions or concerns for your doctor, please call our main line at [phone] and press option 4 to reach your doctor's medical assistant. If no one answers, please leave a voicemail as directed and we will return your call as soon as possible. Messages left after 4 pm will be answered the following business day.   You may also send [place] a message via [place]. We typically respond to MyChart messages within 1-2 business days.  For prescription refills, please ask your pharmacy to contact our office. Our fax number is [phone].  If you have an urgent issue when the clinic is closed that cannot wait until the next business day, you can page your doctor at the number below.    Please note that while we do our best to be available for urgent issues outside of office hours, we are not available 24/7.   If you have an urgent issue and are unable to reach [place], you may choose to seek medical care at your doctor's office, retail clinic, urgent care center, or emergency room.  If you have a medical emergency, please immediately call 911 or go to the emergency department.  Pager Numbers  - Dr. Nehemiah Massed:  [phone]  - Dr. Laurence Ferrari: [phone]  - Dr. Nicole Kindred: [phone]  In the event of inclement weather, please call our main line at [phone] for an update on the status of any delays or closures.  Dermatology Medication Tips: Please keep the boxes that topical medications come in in order to help keep track of the instructions about where and how to use these. Pharmacies typically print the medication instructions only on the boxes and not directly on the medication tubes.   If your medication is too expensive, please contact our office at [phone] option 4 or send [place] a message through [place].   We are unable to tell what your co-pay for medications will be in advance as this is different depending on your insurance coverage. However, we may be able to find a substitute medication at lower cost or fill out paperwork to get insurance to cover a needed medication.   If a prior authorization is required to get your medication covered by your insurance company, please allow [place] 1-2 business days to complete this process.  Drug prices often vary depending on where the prescription is filled and some pharmacies may offer cheaper prices.  The website www.goodrx.com contains coupons for medications through different pharmacies. The prices here do not account for what the cost may be with help from insurance (it may be cheaper with your insurance), but the website can give you the price if you did not use any insurance.  - You can print the associated coupon  and take it with your prescription to the pharmacy.  - You may also stop by our office during regular business hours and pick up a GoodRx coupon card.  - If you need your prescription sent electronically to a different pharmacy, notify our office through [facility] or by phone at [phone] option 4.    5-Fluorouracil/Calcipotriene Patient Education   Actinic keratoses are the dry, red scaly spots on the skin caused by sun  damage. A portion of these spots can turn into skin cancer with time, and treating them can help prevent development of skin cancer.   Treatment of these spots requires removal of the defective skin cells. There are various ways to remove actinic keratoses, including freezing with liquid nitrogen, treatment with creams, or treatment with a blue light procedure in the office.   5-fluorouracil cream is a topical cream used to treat actinic keratoses. It works by interfering with the growth of abnormal fast-growing skin cells, such as actinic keratoses. These cells peel off and are replaced by healthy ones.   5-fluorouracil/calcipotriene is a combination of the 5-fluorouracil cream with a vitamin D analog cream called calcipotriene. The calcipotriene alone does not treat actinic keratoses. However, when it is combined with 5-fluorouracil, it helps the 5-fluorouracil treat the actinic keratoses much faster so that the same results can be achieved with a much shorter treatment time.  INSTRUCTIONS FOR 5-FLUOROURACIL/CALCIPOTRIENE CREAM:   5-fluorouracil/calcipotriene cream typically only needs to be used for 4-7 days. A thin layer should be applied twice a day to the treatment areas recommended by your physician.   If your physician prescribed you separate tubes of 5-fluourouracil and calcipotriene, apply a thin layer of 5-fluorouracil followed by a thin layer of calcipotriene.   Avoid contact with your eyes, nostrils, and mouth. Do not use 5-fluorouracil/calcipotriene cream on infected or open wounds.   You will develop redness, irritation and some crusting at areas where you have pre-cancer damage/actinic keratoses. IF YOU DEVELOP PAIN, BLEEDING, OR SIGNIFICANT CRUSTING, STOP THE TREATMENT EARLY - you have already gotten a good response and the actinic keratoses should clear up well.  Wash your hands after applying 5-fluorouracil 5% cream on your skin.   A moisturizer or sunscreen with a minimum SPF  30 should be applied each morning.   Once you have finished the treatment, you can apply a thin layer of Vaseline twice a day to irritated areas to soothe and calm the areas more quickly. If you experience significant discomfort, contact your physician.  For some patients it is necessary to repeat the treatment for best results.  SIDE EFFECTS: When using 5-fluorouracil/calcipotriene cream, you may have mild irritation, such as redness, dryness, swelling, or a mild burning sensation. This usually resolves within 2 weeks. The more actinic keratoses you have, the more redness and inflammation you can expect during treatment. Eye irritation has been reported rarely. If this occurs, please let us know.  If you have any trouble using this cream, please call the office. If you have any other questions about this information, please do not hesitate to ask me before you leave the office.

## 2021-03-20 NOTE — Progress Notes (Signed)
   Follow-Up Visit   Subjective  Erin Smith is a 60 y.o. female who presents for the following: Follow-up (2 month recheck. Actinic damage. Right cheek. S/P 5FU Calcipotriene Tx. ). Recheck scars on chest. Hx ILK Tx. Patient reports she is still not happy with the areas.    The following portions of the chart were reviewed this encounter and updated as appropriate:  Tobacco  Allergies  Meds  Problems  Med Hx  Surg Hx  Fam Hx      Review of Systems: No other skin or systemic complaints except as noted in HPI or Assessment and Plan.   Objective  Well appearing patient in no apparent distress; mood and affect are within normal limits.  A focused examination was performed including face, chest. Relevant physical exam findings are noted in the Assessment and Plan.  Objective  Chest - Medial [facility]): Pink firm plaque  Objective  Right Cheek: Erythematous thin papules/macules with gritty scale.   Assessment & Plan  Hypertrophic scar Chest - Medial (Center)  Symptomatic  Intralesional injection - Chest - Medial [facility]) Location: mid chest  Informed Consent: Discussed risks (infection, pain, bleeding, bruising, thinning of the skin, loss of skin pigment, lack of resolution, and recurrence of lesion) and benefits of the procedure, as well as the alternatives. Informed consent was obtained. Preparation: The area was prepared a standard fashion.  Anesthesia: none  Procedure Details: An intralesional injection was performed with Kenalog 10 mg/cc. 0.15 cc in total were injected.  Total number of injections: 1  Plan: The patient was instructed on post-op care. Recommend OTC analgesia as needed for pain.   AK (actinic keratosis) Right Cheek  Favored over ISK  S/P 5FU/Calcipotriene for 4 days without much reaction. Restart 5FU/calcipotriene, continue until red and crusty up to 10 days.   Return in about 2 months (around 05/20/2021) for scar recheck.   I, Emelia Salisbury, CMA, am acting as scribe for Forest Gleason, MD.   Documentation: I have reviewed the above documentation for accuracy and completeness, and I agree with the above.  Forest Gleason, MD

## 2021-03-23 ENCOUNTER — Encounter: Payer: Self-pay | Admitting: Dermatology

## 2021-06-04 ENCOUNTER — Ambulatory Visit: Payer: BC Managed Care – PPO | Admitting: Dermatology

## 2021-06-04 ENCOUNTER — Other Ambulatory Visit: Payer: Self-pay

## 2021-06-04 DIAGNOSIS — L821 Other seborrheic keratosis: Secondary | ICD-10-CM | POA: Diagnosis not present

## 2021-06-04 DIAGNOSIS — L905 Scar conditions and fibrosis of skin: Secondary | ICD-10-CM | POA: Diagnosis not present

## 2021-06-04 NOTE — Patient Instructions (Signed)

## 2021-06-04 NOTE — Progress Notes (Signed)
   Follow-Up Visit   Subjective  Erin Smith is a 60 y.o. female who presents for the following: Follow-up (Recheck right cheek. Used 5FU/Calcipotriene for full 10 days. Became red and inflamed. Has not completely resolved. /Recheck scar on chest. Hx ILK injection. Patient thinks needs more injections today. ).  The following portions of the chart were reviewed this encounter and updated as appropriate:  Tobacco  Allergies  Meds  Problems  Med Hx  Surg Hx  Fam Hx      Review of Systems: No other skin or systemic complaints except as noted in HPI or Assessment and Plan.   Objective  Well appearing patient in no apparent distress; mood and affect are within normal limits.  A focused examination was performed including face, chest. Relevant physical exam findings are noted in the Assessment and Plan.  Right Cheek Tan slightly waxy-appearing macule  Chest - Medial (Center) Slightly hypertrophic pink plaque  Assessment & Plan  Seborrheic keratosis Right Cheek  Favor macular SK today. No evidence of AK.   Reassured benign age-related growth.  Recommend observation.  Discussed cryotherapy if spot become irritated or inflamed.  Call if any changes.  Scar Chest - Medial (Center)  Symptomatic Hypertrophic  Intralesional injection - Chest - Medial [facility]) Location: chest  Informed Consent: Discussed risks (infection, pain, bleeding, bruising, thinning of the skin, loss of skin pigment, lack of resolution, and recurrence of lesion) and benefits of the procedure, as well as the alternatives. Informed consent was obtained. Preparation: The area was prepared a standard fashion.  Anesthesia: none  Procedure Details: An intralesional injection was performed with Kenalog 10 mg/cc. 0.15 cc in total were injected.  Total number of injections: 3  Plan: The patient was instructed on post-op care. Recommend OTC analgesia as needed for pain.   Return for TBSE as  scheduled.  I, Emelia Salisbury, CMA, am acting as scribe for Forest Gleason, MD.  Documentation: I have reviewed the above documentation for accuracy and completeness, and I agree with the above.  Forest Gleason, MD

## 2021-06-15 ENCOUNTER — Encounter: Payer: Self-pay | Admitting: Dermatology

## 2021-07-14 ENCOUNTER — Ambulatory Visit: Payer: Self-pay | Admitting: *Deleted

## 2021-07-14 ENCOUNTER — Encounter: Payer: Self-pay | Admitting: Internal Medicine
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
## 2021-07-14 NOTE — Telephone Encounter (Signed)
Temperature 100.3 oral, HA.Home Covid test with positive results now. Reviewed isolation requirements, health precautions and when to seek treatment. OTC medications for discomfort. Patient agrees to advice.   Answer Assessment - Initial Assessment Questions 1. COVID-19 DIAGNOSIS: "Who made your COVID-19 diagnosis?" "Was it confirmed by a positive lab test or self-test?" If not diagnosed by a doctor (or NP/PA), ask "Are there lots of cases (community spread) where you live?" Note: See public health department website, if unsure.     Home test 2. COVID-19 EXPOSURE: "Was there any known exposure to COVID before the symptoms began?" CDC Definition of close contact: within 6 feet (2 meters) for a total of 15 minutes or more over a 24-hour period.      Yes, dinner with parent on Thursday last week. 3. ONSET: "When did the COVID-19 symptoms start?"      My sx began today 4. WORST SYMPTOM: "What is your worst symptom?" (e.g., cough, fever, shortness of breath, muscle aches)     fever 5. COUGH: "Do you have a cough?" If Yes, ask: "How bad is the cough?"       no 6. FEVER: "Do you have a fever?" If Yes, ask: "What is your temperature, how was it measured, and when did it start?"     Yes, oral 7. RESPIRATORY STATUS: "Describe your breathing?" (e.g., shortness of breath, wheezing, unable to speak)      Normal 8. BETTER-SAME-WORSE: "Are you getting better, staying the same or getting worse compared to yesterday?"  If getting worse, ask, "In what way?"     Today is day one 9. HIGH RISK DISEASE: "Do you have any chronic medical problems?" (e.g., asthma, heart or lung disease, weak immune system, obesity, etc.)     no 10. VACCINE: "Have you had the COVID-19 vaccine?" If Yes, ask: "Which one, how many shots, when did you get it?"       no 11. BOOSTER: "Have you received your COVID-19 booster?" If Yes, ask: "Which one and when did you get it?"       no 12. PREGNANCY: "Is there any chance you are pregnant?"  "When was your last menstrual period?"       na 13. OTHER SYMPTOMS: "Do you have any other symptoms?"  (e.g., chills, fatigue, headache, loss of smell or taste, muscle pain, sore throat)       HA and chills 14. O2 SATURATION MONITOR:  "Do you use an oxygen saturation monitor (pulse oximeter) at home?" If Yes, ask "What is your reading (oxygen level) today?" "What is your usual oxygen saturation reading?" (e.g., 95%)       Na  Protocols used: Coronavirus (COVID-19) Diagnosed or Suspected-A-AH

## 2021-07-17 ENCOUNTER — Ambulatory Visit: Payer: BC Managed Care – PPO | Admitting: Dermatology

## 2021-08-12 ENCOUNTER — Other Ambulatory Visit: Payer: Self-pay | Admitting: Internal Medicine

## 2021-08-12 ENCOUNTER — Ambulatory Visit: Payer: BC Managed Care – PPO | Admitting: Dermatology

## 2021-08-12 ENCOUNTER — Other Ambulatory Visit: Payer: Self-pay

## 2021-08-12 DIAGNOSIS — L578 Other skin changes due to chronic exposure to nonionizing radiation: Secondary | ICD-10-CM | POA: Diagnosis not present

## 2021-08-12 DIAGNOSIS — D1800 Hemangioma unspecified site: Secondary | ICD-10-CM

## 2021-08-12 DIAGNOSIS — Z1283 Encounter for screening for malignant neoplasm of skin: Secondary | ICD-10-CM

## 2021-08-12 DIAGNOSIS — R21 Rash and other nonspecific skin eruption: Secondary | ICD-10-CM

## 2021-08-12 DIAGNOSIS — D229 Melanocytic nevi, unspecified: Secondary | ICD-10-CM

## 2021-08-12 DIAGNOSIS — Z85828 Personal history of other malignant neoplasm of skin: Secondary | ICD-10-CM

## 2021-08-12 DIAGNOSIS — L821 Other seborrheic keratosis: Secondary | ICD-10-CM

## 2021-08-12 DIAGNOSIS — L814 Other melanin hyperpigmentation: Secondary | ICD-10-CM

## 2021-08-12 DIAGNOSIS — L570 Actinic keratosis: Secondary | ICD-10-CM

## 2021-08-12 DIAGNOSIS — L720 Epidermal cyst: Secondary | ICD-10-CM

## 2021-08-12 MED ORDER — VALACYCLOVIR HCL 1 G PO TABS: ORAL_TABLET | ORAL | 0 refills | Status: DC

## 2021-08-12 MED ORDER — CLOBETASOL PROPIONATE 0.05 % EX OINT: TOPICAL_OINTMENT | CUTANEOUS | 0 refills | Status: DC

## 2021-08-12 NOTE — Patient Instructions (Addendum)
If you have any questions or concerns for your doctor, please call our main line at [phone] and press option 4 to reach your doctor's medical assistant. If no one answers, please leave a voicemail as directed and we will return your call as soon as possible. Messages left after 4 pm will be answered the following business day.   You may also send [place] a message via [place]. We typically respond to MyChart messages within 1-2 business days.  For prescription refills, please ask your pharmacy to contact our office. Our fax number is [phone].  If you have an urgent issue when the clinic is closed that cannot wait until the next business day, you can page your doctor at the number below.    Please note that while we do our best to be available for urgent issues outside of office hours, we are not available 24/7.   If you have an urgent issue and are unable to reach [place], you may choose to seek medical care at your doctor's office, retail clinic, urgent care center, or emergency room.  If you have a medical emergency, please immediately call 911 or go to the emergency department.  Pager Numbers  - Dr. Nehemiah Massed: [phone]  - Dr. Laurence Ferrari: [phone]  - Dr. Nicole Kindred: [phone]  In the event of inclement weather, please call our main line at [phone] for an update on the status of any delays or closures.  Dermatology Medication Tips: Please keep the boxes that topical medications come in in order to help keep track of the instructions about where and how to use these. Pharmacies typically print the medication instructions only on the boxes and not directly on the medication tubes.  Recommend OTC Gold Bond Rapid Relief Anti-Itch cream (pramoxine + menthol), CeraVe Anti-itch cream or lotion (pramoxine), Sarna lotion (Original- menthol + camphor or Sensitive- pramoxine) or Eucerin 12 hour Itch Relief lotion (menthol) up to 3 times per day to areas on body that are itchy.      If your  medication is too expensive, please contact our office at [phone] option 4 or send [place] a message through [place].   We are unable to tell what your co-pay for medications will be in advance as this is different depending on your insurance coverage. However, we may be able to find a substitute medication at lower cost or fill out paperwork to get insurance to cover a needed medication.   If a prior authorization is required to get your medication covered by your insurance company, please allow [place] 1-2 business days to complete this process.  Drug prices often vary depending on where the prescription is filled and some pharmacies may offer cheaper prices.  The website www.goodrx.com contains coupons for medications through different pharmacies. The prices here do not account for what the cost may be with help from insurance (it may be cheaper with your insurance), but the website can give you the price if you did not use any insurance.  - You can print the associated coupon and take it with your prescription to the pharmacy.  - You may also stop by our office during regular business hours and pick up a GoodRx coupon card.  - If you need your prescription sent electronically to a different pharmacy, notify our office through [facility] or by phone at [phone] option 4.

## 2021-08-12 NOTE — Telephone Encounter (Signed)
Requested medications are due for refill today yes  Requested medications are on the active medication list yes  Last refill 05/10/21  Last visit 07/10/20  Future visit scheduled pt canceled 07/14/21 appt, is scheduled for 11/20/21  Notes to clinic failed protocol of visit within 360 days, please assess.

## 2021-08-12 NOTE — Progress Notes (Signed)
Follow-Up Visit   Subjective  Erin Smith is a 60 y.o. female who presents for the following: Annual Exam (Mole check ). Hx of BCC mid chest removed 12-03-20 The patient presents for Total-Body Skin Exam (TBSE) for skin cancer screening and mole check.  Pt c/o irritation possible bug bite on her neck started 2 days ago getting worse   The following portions of the chart were reviewed this encounter and updated as appropriate:   Tobacco  Allergies  Meds  Problems  Med Hx  Surg Hx  Fam Hx      Review of Systems:  No other skin or systemic complaints except as noted in HPI or Assessment and Plan.  Objective  Well appearing patient in no apparent distress; mood and affect are within normal limits.  A full examination was performed including scalp, head, eyes, ears, nose, lips, neck, chest, axillae, abdomen, back, buttocks, bilateral upper extremities, bilateral lower extremities, hands, feet, fingers, toes, fingernails, and toenails. All findings within normal limits unless otherwise noted below.  upper back Linear erytheamtous pink plaque with vesicles    mid chest Well healed scar with no evidence of recurrence.   right cheek Erythematous thin papules/macules with gritty scale.   right shoulder Subcutaneous nodule.    Assessment & Plan  Rash upper back  Ddx zoster vs contact dermatitis favored  Start Valacyclovir 1 gm take 1 tablet po 3 times a day x 7 days with large glass of water.  Related Procedures HSV and VZV PCR Panel  History of basal cell carcinoma (BCC) mid chest  Clear. Observe for recurrence. Call clinic for new or changing lesions.  Recommend regular skin exams, daily broad-spectrum spf 30+ sunscreen use, and photoprotection.     AK (actinic keratosis) right cheek  Ak-previously treated with Efudex cream; some residual  Actinic keratoses are precancerous spots that appear secondary to cumulative UV radiation exposure/sun exposure over  time. They are chronic with expected duration over 1 year. A portion of actinic keratoses will progress to squamous cell carcinoma of the skin. It is not possible to reliably predict which spots will progress to skin cancer and so treatment is recommended to prevent development of skin cancer.  Recommend daily broad spectrum sunscreen SPF 30+ to sun-exposed areas, reapply every 2 hours as needed.  Recommend staying in the shade or wearing long sleeves, sun glasses (UVA+UVB protection) and wide brim hats (4-inch brim around the entire circumference of the hat). Call for new or changing lesions.   Prior to procedure, discussed risks of blister formation, small wound, skin dyspigmentation, or rare scar following cryotherapy. Recommend Vaseline ointment to treated areas while healing.   If not gone in 4-6 weeks return to the office for a biopsy   Destruction of lesion - right cheek Complexity: simple   Destruction method: cryotherapy   Informed consent: discussed and consent obtained   Timeout:  patient name, date of birth, surgical site, and procedure verified Lesion destroyed using liquid nitrogen: Yes   Region frozen until ice ball extended beyond lesion: Yes   Outcome: patient tolerated procedure well with no complications   Post-procedure details: wound care instructions given    Epidermal inclusion cyst right shoulder  Benign-appearing. Exam most consistent with an epidermal inclusion cyst. Discussed that a cyst is a benign growth that can grow over time and sometimes get irritated or inflamed. Recommend observation if it is not bothersome. Discussed option of surgical excision to remove it if it is growing,  symptomatic, or other changes noted. Please call for new or changing lesions so they can be evaluated.    Lentigines - Scattered tan macules - Due to sun exposure - Benign-appearing, observe - Recommend daily broad spectrum sunscreen SPF 30+ to sun-exposed areas, reapply every 2  hours as needed. - Call for any changes  Seborrheic Keratoses - Stuck-on, waxy, tan-brown papules and/or plaques  - Benign-appearing - Discussed benign etiology and prognosis. - Observe - Call for any changes  Melanocytic Nevi - Tan-brown and/or pink-flesh-colored symmetric macules and papules - Benign appearing on exam today - Observation - Call clinic for new or changing moles - Recommend daily use of broad spectrum spf 30+ sunscreen to sun-exposed areas.   Hemangiomas - Red papules - Discussed benign nature - Observe - Call for any changes  Actinic Damage - Chronic condition, secondary to cumulative UV/sun exposure - diffuse scaly erythematous macules with underlying dyspigmentation - Recommend daily broad spectrum sunscreen SPF 30+ to sun-exposed areas, reapply every 2 hours as needed.  - Staying in the shade or wearing long sleeves, sun glasses (UVA+UVB protection) and wide brim hats (4-inch brim around the entire circumference of the hat) are also recommended for sun protection.  - Call for new or changing lesions.  Skin cancer screening performed today.   Return in about 3 months (around 11/12/2021) for recheck Ak at right cheek, FBSE in 1 year .  I, Marye Round, CMA, am acting as scribe for Forest Gleason, MD .   Documentation: I have reviewed the above documentation for accuracy and completeness, and I agree with the above.  Forest Gleason, MD

## 2021-08-13 ENCOUNTER — Other Ambulatory Visit: Payer: Self-pay | Admitting: Internal Medicine

## 2021-08-13 DIAGNOSIS — G43909 Migraine, unspecified, not intractable, without status migrainosus: Secondary | ICD-10-CM

## 2021-08-13 NOTE — Telephone Encounter (Signed)
Copied from [place] [id number]. Topic: Quick Communication - Rx Refill/Question >> Aug 13, 2021  2:37 PM Leward Quan A wrote: Medication: esomeprazole (NEXIUM) 40 MG capsule  Has the patient contacted their pharmacy? Yes.  Told patient Dr denied refill  (Agent: If no, request that the patient contact the pharmacy for the refill.) (Agent: If yes, when and what did the pharmacy advise?)  Preferred Pharmacy (with phone number or street name): [id number] - [place], [place]  Phone:  [phone] Fax:  [phone]    Has the patient been seen for an appointment in the last year OR does the patient have an upcoming appointment? Yes.    Agent: Please be advised that RX refills may take up to 3 business days. We ask that you follow-up with your pharmacy.

## 2021-08-13 NOTE — Telephone Encounter (Signed)
Requested medications are due for refill today.  yes  Requested medications are on the active medications list.  yes  Last refill. 07/10/2020  Future visit scheduled.   yes  Notes to clinic.  Patient is more than 3 months over due for office visit.

## 2021-08-14 ENCOUNTER — Telehealth: Payer: Self-pay

## 2021-08-14 LAB — HSV AND VZV PCR PANEL
HSV 2 DNA: NEGATIVE
HSV-1 DNA: NEGATIVE
Varicella-Zoster, PCR: NEGATIVE

## 2021-08-14 MED ORDER — ESOMEPRAZOLE MAGNESIUM 40 MG PO CPDR: DELAYED_RELEASE_CAPSULE | ORAL | 0 refills | Status: DC

## 2021-08-14 NOTE — Telephone Encounter (Signed)
-----   Message from [place], MD sent at 08/14/2021 12:42 PM EDT ----- No HSV (cold sore virus) or VZV (shingles) detected. If she is feeling fine on the medicine, recommend finishing course of valacyclovir just in case (occasionally the test does not find the virus even if it is causing the rash).   MAs please call. Thank you!

## 2021-08-14 NOTE — Telephone Encounter (Signed)
Spoke with pt and advised her of results and recommendation from Dr. Laurence Ferrari. Pt states that she is feeling fine on the medication and will finish the course. She had no further concerns.

## 2021-08-14 NOTE — Telephone Encounter (Signed)
Courtesy refill given until scheduled appointment per protocol.  Requested Prescriptions  Pending Prescriptions Disp Refills  . esomeprazole (NEXIUM) 40 MG capsule 25 capsule 0    Sig: TAKE 1 CAPSULE BY MOUTH EVERY DAY     Gastroenterology: Proton Pump Inhibitors Failed - 08/13/2021  4:53 PM      Failed - Valid encounter within last 12 months    Recent Outpatient Visits          1 year ago Annual physical exam   [facility] Glean Hess, MD   2 years ago Annual physical exam   [facility] Glean Hess, MD   3 years ago Annual physical exam   [facility] Glean Hess, MD   4 years ago Annual physical exam   [facility] Glean Hess, MD   4 years ago Influenza-like illness   [facility] Glean Hess, MD      Future Appointments            In 3 weeks Army Melia Jesse Sans, MD [facility] [place]   In 3 months [facility], [place], [place]

## 2021-08-18 ENCOUNTER — Telehealth: Payer: Self-pay

## 2021-08-18 NOTE — Telephone Encounter (Signed)
Pt. Calling to schedule a recall colonoscopy

## 2021-08-19 ENCOUNTER — Other Ambulatory Visit: Payer: Self-pay

## 2021-08-19 DIAGNOSIS — Z86010 Personal history of colonic polyps: Secondary | ICD-10-CM

## 2021-08-19 MED ORDER — NA SULFATE-K SULFATE-MG SULF 17.5-3.13-1.6 GM/177ML PO SOLN: 1.0000 | Freq: Once | ORAL | 0 refills | Status: AC

## 2021-08-19 NOTE — Telephone Encounter (Signed)
Returned patients call. LVM to call back °

## 2021-08-19 NOTE — Progress Notes (Signed)
Gastroenterology Pre-Procedure Review  Request Date: 09/10/21 Requesting Physician: Dr. Marius Ditch  PATIENT REVIEW QUESTIONS: The patient responded to the following health history questions as indicated:    1. Are you having any GI issues? no 2. Do you have a personal history of Polyps? yes (09/05/2018) 3. Do you have a family history of Colon Cancer or Polyps? yes (Mother polyps) 4. Diabetes Mellitus? no 5. Joint replacements in the past 12 months?no 6. Major health problems in the past 3 months?no 7. Any artificial heart valves, MVP, or defibrillator?no    MEDICATIONS & ALLERGIES:    Patient reports the following regarding taking any anticoagulation/antiplatelet therapy:   Plavix, Coumadin, Eliquis, Xarelto, Lovenox, Pradaxa, Brilinta, or Effient? no Aspirin? yes (81 mg)  Patient confirms/reports the following medications:  Current Outpatient Medications  Medication Sig Dispense Refill   ALPRAZolam (XANAX) 0.25 MG tablet Take tab daily PRN 30 tablet 0   aspirin 81 MG tablet Take 81 mg by mouth daily.     clobetasol ointment (TEMOVATE) 0.05 % Apply to affected skin on back twice a day for up to 3 weeks then stop, Avoid applying to face, groin, and axilla. Use as directed. Risk of skin atrophy with long-term use reviewed. 60 g 0   esomeprazole (NEXIUM) 40 MG capsule TAKE 1 CAPSULE BY MOUTH EVERY DAY 25 capsule 0   fexofenadine (ALLEGRA) 180 MG tablet TAKE 1 TABLET BY MOUTH EVERY DAY 30 tablet 3   fluorouracil (EFUDEX) 5 % cream Apply topically 2 (two) times daily. Use for 4 days to right cheek 15 g 1   mupirocin ointment (BACTROBAN) 2 % Apply to wound once daily during wound dressing change. 22 g 0   propranolol (INDERAL) 20 MG tablet TAKE 1 TABLET BY MOUTH TWICE A DAY 180 tablet 0   valACYclovir (VALTREX) 1000 MG tablet Take 1 tablet 3 times a day for 7 days 21 tablet 0   No current facility-administered medications for this visit.    Patient confirms/reports the following allergies:   Allergies  Allergen Reactions   Penicillins     No orders of the defined types were placed in this encounter.   AUTHORIZATION INFORMATION Primary Insurance: 1D#: Group #:  Secondary Insurance: 1D#: Group #:  SCHEDULE INFORMATION: Date: 09/10/21 Time: Location: [place]

## 2021-08-21 ENCOUNTER — Encounter: Payer: Self-pay | Admitting: Dermatology

## 2021-09-02 ENCOUNTER — Other Ambulatory Visit: Payer: Self-pay | Admitting: Internal Medicine

## 2021-09-02 NOTE — Telephone Encounter (Signed)
Patient has been given courtesy RF- no need for RF at this time Requested Prescriptions  Pending Prescriptions Disp Refills  . esomeprazole (NEXIUM) 40 MG capsule [Pharmacy Med Name: ESOMEPRAZOLE MAG DR 40 MG CAP] 25 capsule 0    Sig: TAKE 1 CAPSULE BY MOUTH EVERY DAY     Gastroenterology: Proton Pump Inhibitors Failed - 09/02/2021  2:32 PM      Failed - Valid encounter within last 12 months    Recent Outpatient Visits          1 year ago Annual physical exam   [facility] Glean Hess, MD   2 years ago Annual physical exam   [facility] Glean Hess, MD   3 years ago Annual physical exam   [facility] Glean Hess, MD   4 years ago Annual physical exam   [facility] Glean Hess, MD   4 years ago Influenza-like illness   [facility] Glean Hess, MD      Future Appointments            In 6 days [facility] Jesse Sans, MD [facility], [place]   In 2 months [facility], [place], MD [place]

## 2021-09-08 ENCOUNTER — Other Ambulatory Visit (HOSPITAL_COMMUNITY)
Admission: RE | Admit: 2021-09-08 | Discharge: 2021-09-08 | Disposition: A | Discharge status: Home / Self Care | Payer: BC Managed Care – PPO | Source: Ambulatory Visit | Attending: Internal Medicine | Admitting: Internal Medicine

## 2021-09-08 ENCOUNTER — Ambulatory Visit (INDEPENDENT_AMBULATORY_CARE_PROVIDER_SITE_OTHER): Payer: BC Managed Care – PPO | Admitting: Internal Medicine

## 2021-09-08 ENCOUNTER — Other Ambulatory Visit: Payer: Self-pay

## 2021-09-08 ENCOUNTER — Encounter: Payer: Self-pay | Admitting: Internal Medicine

## 2021-09-08 VITALS — BP 112/70 | HR 91 | Ht 70.0 in | Wt 181.2 lb

## 2021-09-08 DIAGNOSIS — Z0000 Encounter for general adult medical examination without abnormal findings: Secondary | ICD-10-CM

## 2021-09-08 DIAGNOSIS — Z1211 Encounter for screening for malignant neoplasm of colon: Secondary | ICD-10-CM

## 2021-09-08 DIAGNOSIS — Z124 Encounter for screening for malignant neoplasm of cervix: Secondary | ICD-10-CM

## 2021-09-08 DIAGNOSIS — E785 Hyperlipidemia, unspecified: Secondary | ICD-10-CM

## 2021-09-08 DIAGNOSIS — F419 Anxiety disorder, unspecified: Secondary | ICD-10-CM

## 2021-09-08 DIAGNOSIS — R002 Palpitations: Secondary | ICD-10-CM

## 2021-09-08 DIAGNOSIS — Z1231 Encounter for screening mammogram for malignant neoplasm of breast: Secondary | ICD-10-CM | POA: Diagnosis not present

## 2021-09-08 DIAGNOSIS — K219 Gastro-esophageal reflux disease without esophagitis: Secondary | ICD-10-CM

## 2021-09-08 DIAGNOSIS — G43909 Migraine, unspecified, not intractable, without status migrainosus: Secondary | ICD-10-CM

## 2021-09-08 MED ORDER — ESOMEPRAZOLE MAGNESIUM 40 MG PO CPDR: DELAYED_RELEASE_CAPSULE | ORAL | 3 refills | Status: DC

## 2021-09-08 MED ORDER — ALPRAZOLAM 0.25 MG PO TABS: ORAL_TABLET | ORAL | 0 refills | Status: DC

## 2021-09-08 MED ORDER — PROPRANOLOL HCL 20 MG PO TABS: 20.0000 mg | ORAL_TABLET | Freq: Two times a day (BID) | ORAL | 3 refills | Status: DC
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
## 2021-09-09 ENCOUNTER — Encounter: Payer: Self-pay | Admitting: Gastroenterology

## 2021-09-09 ENCOUNTER — Encounter: Payer: Self-pay | Admitting: Internal Medicine

## 2021-09-09 DIAGNOSIS — R7303 Prediabetes: Secondary | ICD-10-CM | POA: Insufficient documentation

## 2021-09-09 LAB — COMPREHENSIVE METABOLIC PANEL
ALT: 25 IU/L (ref 0–32)
AST: 37 IU/L (ref 0–40)
Albumin/Globulin Ratio: 1.8 (ref 1.2–2.2)
Albumin: 4.8 g/dL (ref 3.8–4.9)
Alkaline Phosphatase: 75 IU/L (ref 44–121)
BUN/Creatinine Ratio: 9 (ref 9–23)
BUN: 7 mg/dL (ref 6–24)
Bilirubin Total: 0.3 mg/dL (ref 0.0–1.2)
CO2: 24 mmol/L (ref 20–29)
Calcium: 10.3 mg/dL — ABNORMAL HIGH (ref 8.7–10.2)
Chloride: 99 mmol/L (ref 96–106)
Creatinine, Ser: 0.8 mg/dL (ref 0.57–1.00)
Globulin, Total: 2.7 g/dL (ref 1.5–4.5)
Glucose: 117 mg/dL — ABNORMAL HIGH (ref 70–99)
Potassium: 4.7 mmol/L (ref 3.5–5.2)
Sodium: 138 mmol/L (ref 134–144)
Total Protein: 7.5 g/dL (ref 6.0–8.5)
eGFR: 85 mL/min/{1.73_m2} (ref >59)

## 2021-09-09 LAB — CBC WITH DIFFERENTIAL/PLATELET
Basophils Absolute: 0.1 10*3/uL (ref 0.0–0.2)
Basos: 1 %
EOS (ABSOLUTE): 0.1 10*3/uL (ref 0.0–0.4)
Eos: 2 %
Hematocrit: 37.7 % (ref 34.0–46.6)
Hemoglobin: 12.7 g/dL (ref 11.1–15.9)
Immature Grans (Abs): 0 10*3/uL (ref 0.0–0.1)
Immature Granulocytes: 0 %
Lymphocytes Absolute: 1.8 10*3/uL (ref 0.7–3.1)
Lymphs: 38 %
MCH: 29.7 pg (ref 26.6–33.0)
MCHC: 33.7 g/dL (ref 31.5–35.7)
MCV: 88 fL (ref 79–97)
Monocytes Absolute: 0.3 10*3/uL (ref 0.1–0.9)
Monocytes: 7 %
Neutrophils Absolute: 2.4 10*3/uL (ref 1.4–7.0)
Neutrophils: 52 %
Platelets: 280 10*3/uL (ref 150–450)
RBC: 4.28 x10E6/uL (ref 3.77–5.28)
RDW: 12.5 % (ref 11.7–15.4)
WBC: 4.6 10*3/uL (ref 3.4–10.8)

## 2021-09-09 LAB — LIPID PANEL
Chol/HDL Ratio: 4.1 ratio (ref 0.0–4.4)
Cholesterol, Total: 257 mg/dL — ABNORMAL HIGH (ref 100–199)
HDL: 62 mg/dL (ref >39)
LDL Chol Calc (NIH): 167 mg/dL — ABNORMAL HIGH (ref 0–99)
Triglycerides: 158 mg/dL — ABNORMAL HIGH (ref 0–149)
VLDL Cholesterol Cal: 28 mg/dL (ref 5–40)

## 2021-09-09 LAB — CYTOLOGY - PAP
Comment: NEGATIVE
Diagnosis: NEGATIVE
High risk HPV: NEGATIVE

## 2021-09-09 LAB — HEMOGLOBIN A1C
Est. average glucose Bld gHb Est-mCnc: 126 mg/dL
Hgb A1c MFr Bld: 6 % — ABNORMAL HIGH (ref 4.8–5.6)

## 2021-09-09 LAB — TSH: TSH: 1.36 u[IU]/mL (ref 0.450–4.500)

## 2021-09-10 ENCOUNTER — Ambulatory Visit
Admission: RE | Admit: 2021-09-10 | Discharge: 2021-09-10 | Disposition: A | Discharge status: Home / Self Care | Payer: BC Managed Care – PPO | Source: Ambulatory Visit | Attending: Gastroenterology | Admitting: Gastroenterology

## 2021-09-10 ENCOUNTER — Ambulatory Visit: Payer: BC Managed Care – PPO | Admitting: Anesthesiology

## 2021-09-10 ENCOUNTER — Encounter: Payer: Self-pay | Admitting: Gastroenterology

## 2021-09-10 ENCOUNTER — Encounter
Admission: RE | Disposition: A | Discharge status: Home / Self Care | Payer: Self-pay | Source: Ambulatory Visit | Attending: Gastroenterology

## 2021-09-10 DIAGNOSIS — D123 Benign neoplasm of transverse colon: Secondary | ICD-10-CM | POA: Insufficient documentation

## 2021-09-10 DIAGNOSIS — K219 Gastro-esophageal reflux disease without esophagitis: Secondary | ICD-10-CM | POA: Insufficient documentation

## 2021-09-10 DIAGNOSIS — K635 Polyp of colon: Secondary | ICD-10-CM | POA: Diagnosis not present

## 2021-09-10 DIAGNOSIS — F419 Anxiety disorder, unspecified: Secondary | ICD-10-CM | POA: Diagnosis not present

## 2021-09-10 DIAGNOSIS — K6389 Other specified diseases of intestine: Secondary | ICD-10-CM | POA: Diagnosis not present

## 2021-09-10 DIAGNOSIS — Z09 Encounter for follow-up examination after completed treatment for conditions other than malignant neoplasm: Secondary | ICD-10-CM | POA: Diagnosis present

## 2021-09-10 DIAGNOSIS — F17210 Nicotine dependence, cigarettes, uncomplicated: Secondary | ICD-10-CM | POA: Insufficient documentation

## 2021-09-10 DIAGNOSIS — Z860100 Personal history of colon polyps, unspecified: Secondary | ICD-10-CM

## 2021-09-10 HISTORY — PX: COLONOSCOPY WITH PROPOFOL: SHX5780

## 2021-09-10 SURGERY — COLONOSCOPY WITH PROPOFOL
Anesthesia: General

## 2021-09-10 MED ORDER — MIDAZOLAM HCL 2 MG/2ML IJ SOLN
INTRAMUSCULAR | Status: DC | PRN
  Administered 2021-09-10: 2 mg via INTRAVENOUS

## 2021-09-10 MED ORDER — EPHEDRINE 5 MG/ML INJ
INTRAVENOUS | Status: AC
  Filled 2021-09-10: qty 5

## 2021-09-10 MED ORDER — LIDOCAINE HCL (CARDIAC) PF 100 MG/5ML IV SOSY
PREFILLED_SYRINGE | INTRAVENOUS | Status: DC | PRN
  Administered 2021-09-10: 50 mg via INTRAVENOUS

## 2021-09-10 MED ORDER — EPHEDRINE SULFATE 50 MG/ML IJ SOLN
INTRAMUSCULAR | Status: DC | PRN
  Administered 2021-09-10: 10 mg via INTRAVENOUS

## 2021-09-10 MED ORDER — FENTANYL CITRATE (PF) 100 MCG/2ML IJ SOLN
INTRAMUSCULAR | Status: DC | PRN
  Administered 2021-09-10 (×2): 50 ug via INTRAVENOUS

## 2021-09-10 MED ORDER — MIDAZOLAM HCL 2 MG/2ML IJ SOLN
INTRAMUSCULAR | Status: AC
  Filled 2021-09-10: qty 2

## 2021-09-10 MED ORDER — SODIUM CHLORIDE 0.9 % IV SOLN: INTRAVENOUS | Status: DC

## 2021-09-10 MED ORDER — FENTANYL CITRATE (PF) 100 MCG/2ML IJ SOLN
INTRAMUSCULAR | Status: AC
  Filled 2021-09-10: qty 2

## 2021-09-10 MED ORDER — PROPOFOL 500 MG/50ML IV EMUL
INTRAVENOUS | Status: DC | PRN
  Administered 2021-09-10: 50 ug/kg/min via INTRAVENOUS

## 2021-09-10 MED ORDER — PROPOFOL 10 MG/ML IV BOLUS
INTRAVENOUS | Status: DC | PRN
  Administered 2021-09-10: 10 mg via INTRAVENOUS
  Administered 2021-09-10: 40 mg via INTRAVENOUS

## 2021-09-10 NOTE — Anesthesia Preprocedure Evaluation (Addendum)
Anesthesia Evaluation  Patient identified by MRN, date of birth, ID band Patient awake    Reviewed: Allergy & Precautions, NPO status , Patient's Chart, lab work & pertinent test results  History of Anesthesia Complications Negative for: history of anesthetic complications  Airway Mallampati: I  TM Distance: >3 FB Neck ROM: Full    Dental   Pulmonary neg pulmonary ROS, Current Smoker and Patient abstained from smoking.,    Pulmonary exam normal        Cardiovascular negative cardio ROS Normal cardiovascular exam     Neuro/Psych  Headaches, Anxiety     Lumbar radiculopathy    GI/Hepatic Neg liver ROS, GERD  ,  Endo/Other  negative endocrine ROS  Renal/GU negative Renal ROS     Musculoskeletal  (+) Arthritis ,   Abdominal   Peds  Hematology negative hematology ROS (+)   Anesthesia Other Findings   Reproductive/Obstetrics                           Anesthesia Physical Anesthesia Plan  ASA: 2  Anesthesia Plan: General   Post-op Pain Management:    Induction:   PONV Risk Score and Plan:   Airway Management Planned:   Additional Equipment:   Intra-op Plan:   Post-operative Plan:   Informed Consent: I have reviewed the patients History and Physical, chart, labs and discussed the procedure including the risks, benefits and alternatives for the proposed anesthesia with the patient or authorized representative who has indicated his/her understanding and acceptance.       Plan Discussed with: CRNA  Anesthesia Plan Comments:         Anesthesia Quick Evaluation

## 2021-09-10 NOTE — Transfer of Care (Signed)
Immediate Anesthesia Transfer of Care Note  Patient: Erin Smith  Procedure(s) Performed: COLONOSCOPY WITH PROPOFOL  Patient Location: PACU  Anesthesia Type:General  Level of Consciousness: sedated  Airway & Oxygen Therapy: Patient Spontanous Breathing and Patient connected to nasal cannula oxygen  Post-op Assessment: Report given to RN and Post -op Vital signs reviewed and stable  Post vital signs: Reviewed and stable  Last Vitals:  Vitals Value Taken Time  BP    Temp    Pulse    Resp    SpO2      Last Pain:  Vitals:   09/10/21 0958  TempSrc: Temporal  PainSc: 0-No pain         Complications: No notable events documented.

## 2021-09-10 NOTE — Anesthesia Postprocedure Evaluation (Signed)
Anesthesia Post Note  Patient: Erin Smith  Procedure(s) Performed: COLONOSCOPY WITH PROPOFOL  Patient location during evaluation: PACU Anesthesia Type: General Level of consciousness: awake and alert Pain management: pain level controlled Vital Signs Assessment: post-procedure vital signs reviewed and stable Respiratory status: spontaneous breathing, nonlabored ventilation, respiratory function stable and patient connected to nasal cannula oxygen Cardiovascular status: blood pressure returned to baseline and stable Postop Assessment: no apparent nausea or vomiting Anesthetic complications: no   No notable events documented.   Last Vitals:  Vitals:   09/10/21 1142 09/10/21 1150  BP: 113/69 117/68  Pulse: 73 77  Resp: 11 16  Temp:    SpO2: 100% 100%    Last Pain:  Vitals:   09/10/21 1150  TempSrc:   PainSc: 0-No pain                 [place]
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
## 2021-09-10 NOTE — Op Note (Signed)
[facility] Gastroenterology Patient Name: Erin Smith Procedure Date: 09/10/2021 10:54 AM MRN: [id number] Account #: [id number] Date of Birth: November 19, 1960 Admit Type: Outpatient Age: 60 Room: [facility] ENDO ROOM 3 Gender: Female Note Status: Finalized Instrument Name: Jasper Riling [id number] Procedure:             Colonoscopy Indications:           Surveillance: Personal history of adenomatous polyps                         on last colonoscopy 3 years ago, Last colonoscopy:                         November 2019 Providers:             Lin Landsman MD, MD Medicines:             General Anesthesia Complications:         No immediate complications. Estimated blood loss: None. Procedure:             Pre-Anesthesia Assessment:                        - Prior to the procedure, a History and Physical was                         performed, and patient medications and allergies were                         reviewed. The patient is competent. The risks and                         benefits of the procedure and the sedation options and                         risks were discussed with the patient. All questions                         were answered and informed consent was obtained.                         Patient identification and proposed procedure were                         verified by the physician, the nurse, the                         anesthesiologist, the anesthetist and the technician                         in the pre-procedure area in the procedure room in the                         endoscopy suite. Mental Status Examination: alert and                         oriented. Airway Examination: normal oropharyngeal                         airway and neck mobility.  Respiratory Examination:                         clear to auscultation. CV Examination: normal.                         Prophylactic Antibiotics: The patient does not require                          prophylactic antibiotics. Prior Anticoagulants: The                         patient has taken no previous anticoagulant or                         antiplatelet agents. ASA Grade Assessment: II - A                         patient with mild systemic disease. After reviewing                         the risks and benefits, the patient was deemed in                         satisfactory condition to undergo the procedure. The                         anesthesia plan was to use general anesthesia.                         Immediately prior to administration of medications,                         the patient was re-assessed for adequacy to receive                         sedatives. The heart rate, respiratory rate, oxygen                         saturations, blood pressure, adequacy of pulmonary                         ventilation, and response to care were monitored                         throughout the procedure. The physical status of the                         patient was re-assessed after the procedure.                        After obtaining informed consent, the colonoscope was                         passed under direct vision. Throughout the procedure,                         the patient's blood pressure, pulse, and oxygen  saturations were monitored continuously. The                         Colonoscope was introduced through the anus and                         advanced to the the cecum, identified by appendiceal                         orifice and ileocecal valve. The colonoscopy was                         performed without difficulty. The patient tolerated                         the procedure well. The quality of the bowel                         preparation was evaluated using the BBPS [facility] Bowel                         Preparation Scale) with scores of: Right Colon = 3,                         Transverse Colon = 3 and Left Colon = 3 (entire mucosa                          seen well with no residual staining, small fragments                         of stool or opaque liquid). The total BBPS score                         equals 9. Findings:      The perianal and digital rectal examinations were normal. Pertinent       negatives include normal sphincter tone and no palpable rectal lesions.      Two sessile polyps were found in the transverse colon. The polyps were 4       to 5 mm in size. These polyps were removed with a cold snare. Resection       and retrieval were complete. Estimated blood loss: none.      The retroflexed view of the distal rectum and anal verge was normal and       showed no anal or rectal abnormalities. Impression:            - Two 4 to 5 mm polyps in the transverse colon,                         removed with a cold snare. Resected and retrieved.                        - The distal rectum and anal verge are normal on                         retroflexion view. Recommendation:        - Discharge patient to home (with escort).                        -  Resume previous diet today.                        - Continue present medications.                        - Await pathology results.                        - Repeat colonoscopy in 5 years for surveillance. Procedure Code(s):     --- Professional ---                        [id number], Colonoscopy, flexible; with removal of                         tumor(s), polyp(s), or other lesion(s) by snare                         technique Diagnosis Code(s):     --- Professional ---                        Z86.010, Personal history of colonic polyps                        K63.5, Polyp of colon CPT copyright 2019 American Medical Association. All rights reserved. The codes documented in this report are preliminary and upon coder review may  be revised to meet current compliance requirements. Dr. Ulyess Mort Lin Landsman MD, MD 09/10/2021 11:22:30 AM This report has been signed  electronically. Number of Addenda: 0 Note Initiated On: 09/10/2021 10:54 AM Scope Withdrawal Time: 0 hours 10 minutes 1 second  Total Procedure Duration: 0 hours 12 minutes 51 seconds  Estimated Blood Loss:  Estimated blood loss: none.      [facility]

## 2021-09-11 ENCOUNTER — Encounter: Payer: Self-pay | Admitting: Gastroenterology

## 2021-09-11 LAB — SURGICAL PATHOLOGY

## 2021-11-13 ENCOUNTER — Ambulatory Visit: Payer: BC Managed Care – PPO | Admitting: Dermatology

## 2021-11-13 ENCOUNTER — Other Ambulatory Visit: Payer: Self-pay

## 2021-11-13 DIAGNOSIS — Z872 Personal history of diseases of the skin and subcutaneous tissue: Secondary | ICD-10-CM

## 2021-11-13 DIAGNOSIS — L814 Other melanin hyperpigmentation: Secondary | ICD-10-CM | POA: Diagnosis not present

## 2021-11-13 DIAGNOSIS — L578 Other skin changes due to chronic exposure to nonionizing radiation: Secondary | ICD-10-CM | POA: Diagnosis not present

## 2021-11-13 NOTE — Progress Notes (Addendum)
° °  Follow-Up Visit   Subjective  Erin Smith is a 61 y.o. female who presents for the following: Actinic Keratosis (R cheek - resolved per patient, recheck today).  The following portions of the chart were reviewed this encounter and updated as appropriate:   Tobacco   Allergies   Meds   Problems   Med Hx   Surg Hx   Fam Hx       Review of Systems:  No other skin or systemic complaints except as noted in HPI or Assessment and Plan.  Objective  Well appearing patient in no apparent distress; mood and affect are within normal limits.  A focused examination was performed including the face. Relevant physical exam findings are noted in the Assessment and Plan.  R cheek Clear.    Assessment & Plan  History of actinic keratosis R cheek  Clear. Observe for recurrence. Call clinic for new or changing lesions.  Recommend regular skin exams, daily broad-spectrum spf 30+ sunscreen use, and photoprotection.      Lentigines - Scattered tan macules - Due to sun exposure - Benign-appering, observe - Recommend daily broad spectrum sunscreen SPF 30+ to sun-exposed areas, reapply every 2 hours as needed. - Call for any changes  Actinic Damage - chronic, secondary to cumulative UV radiation exposure/sun exposure over time - diffuse scaly erythematous macules with underlying dyspigmentation - Recommend daily broad spectrum sunscreen SPF 30+ to sun-exposed areas, reapply every 2 hours as needed.  - Recommend staying in the shade or wearing long sleeves, sun glasses (UVA+UVB protection) and wide brim hats (4-inch brim around the entire circumference of the hat). - Call for new or changing lesions. - Recommend taking Heliocare sun protection supplement daily in sunny weather for additional sun protection. For maximum protection on the sunniest days, you can take up to 2 capsules of regular Heliocare OR take 1 capsule of Heliocare Ultra. For prolonged exposure (such as a full day in the  sun), you can repeat your dose of the supplement 4 hours after your first dose.   Return for appointment as scheduled (FBSE).  Luther Redo, CMA, am acting as scribe for Forest Gleason, MD .  Documentation: I have reviewed the above documentation for accuracy and completeness, and I agree with the above.  Forest Gleason, MD

## 2021-11-13 NOTE — Patient Instructions (Addendum)
Recommend taking Heliocare sun protection supplement daily in sunny weather for additional sun protection. For maximum protection on the sunniest days, you can take up to 2 capsules of regular Heliocare OR take 1 capsule of Heliocare Ultra. For prolonged exposure (such as a full day in the sun), you can repeat your dose of the supplement 4 hours after your first dose. Heliocare can be purchased at [facility] or at [email or web address].   If You Need Anything After Your Visit  If you have any questions or concerns for your doctor, please call our main line at [phone] and press option 4 to reach your doctor's medical assistant. If no one answers, please leave a voicemail as directed and we will return your call as soon as possible. Messages left after 4 pm will be answered the following business day.   You may also send [place] a message via [place]. We typically respond to MyChart messages within 1-2 business days.  For prescription refills, please ask your pharmacy to contact our office. Our fax number is [phone].  If you have an urgent issue when the clinic is closed that cannot wait until the next business day, you can page your doctor at the number below.    Please note that while we do our best to be available for urgent issues outside of office hours, we are not available 24/7.   If you have an urgent issue and are unable to reach [place], you may choose to seek medical care at your doctor's office, retail clinic, urgent care center, or emergency room.  If you have a medical emergency, please immediately call 911 or go to the emergency department.  Pager Numbers  - Dr. Nehemiah Massed: [phone]  - Dr. Laurence Ferrari: [phone]  - Dr. Nicole Kindred: [phone]  In the event of inclement weather, please call our main line at [phone] for an update on the status of any delays or closures.  Dermatology Medication Tips: Please keep the boxes that topical medications come in in order to help  keep track of the instructions about where and how to use these. Pharmacies typically print the medication instructions only on the boxes and not directly on the medication tubes.   If your medication is too expensive, please contact our office at [phone] option 4 or send [place] a message through [place].   We are unable to tell what your co-pay for medications will be in advance as this is different depending on your insurance coverage. However, we may be able to find a substitute medication at lower cost or fill out paperwork to get insurance to cover a needed medication.   If a prior authorization is required to get your medication covered by your insurance company, please allow [place] 1-2 business days to complete this process.  Drug prices often vary depending on where the prescription is filled and some pharmacies may offer cheaper prices.  The website www.goodrx.com contains coupons for medications through different pharmacies. The prices here do not account for what the cost may be with help from insurance (it may be cheaper with your insurance), but the website can give you the price if you did not use any insurance.  - You can print the associated coupon and take it with your prescription to the pharmacy.  - You may also stop by our office during regular business hours and pick up a GoodRx coupon card.  - If you need your prescription sent electronically to a different pharmacy, notify our office through [facility]  MyChart or by phone at [phone] option 4.     Si Usted Necesita Algo Despus de Su Visita  Tambin puede enviarnos un mensaje a travs de Pharmacist, community. Por lo general respondemos a los mensajes de MyChart en el transcurso de 1 a 2 das hbiles.  Para renovar recetas, por favor pida a su farmacia que se ponga en contacto con nuestra oficina. Harland Dingwall de fax es [place] [phone].  Si tiene un asunto urgente cuando la clnica est cerrada y que no puede esperar hasta el  siguiente da hbil, puede llamar/localizar a su doctor(a) al nmero que aparece a continuacin.   Por favor, tenga en cuenta que aunque hacemos todo lo posible para estar disponibles para asuntos urgentes fuera del horario de [place], no estamos disponibles las 24 horas del da, los 7 das de la [place].   Si tiene un problema urgente y no puede comunicarse con nosotros, puede optar por buscar atencin mdica  en el consultorio de su doctor(a), en una clnica privada, en un centro de atencin urgente o en una sala de emergencias.  Si tiene Engineering geologist, por favor llame inmediatamente al 911 o vaya a la sala de emergencias.  Nmeros de bper  - Dr. Nehemiah Massed: [phone]  - Dra. Moye: [phone]  - Dra. Nicole Kindred: [phone]  En caso de inclemencias del [place], por favor llame a Johnsie Kindred principal al [phone] para una actualizacin sobre el [place] de cualquier retraso o cierre.  Consejos para la medicacin en dermatologa: Por favor, guarde las cajas en las que vienen los medicamentos de uso tpico para ayudarle a seguir las instrucciones sobre dnde y cmo usarlos. Las farmacias generalmente imprimen las instrucciones del medicamento slo en las cajas y no directamente en los tubos del [place].   Si su medicamento es muy caro, por favor, pngase en contacto con Zigmund Daniel llamando al [phone] y presione la opcin 4 o envenos un mensaje a travs de Pharmacist, community.   No podemos decirle cul ser su copago por los medicamentos por adelantado ya que esto es diferente dependiendo de la cobertura de su seguro. Sin embargo, es posible que podamos encontrar un medicamento sustituto a Electrical engineer un formulario para que el seguro cubra el medicamento que se considera necesario.   Si se requiere una autorizacin previa para que su compaa de seguros [place] su medicamento, por favor permtanos de 1 a 2 das hbiles para completar este proceso.  Los precios de los  medicamentos varan con frecuencia dependiendo del Environmental consultant de dnde se surte la receta y alguna farmacias pueden ofrecer precios ms baratos.  El sitio web www.goodrx.com tiene cupones para medicamentos de Airline pilot. Los precios aqu no tienen en cuenta lo que podra costar con la ayuda del seguro (puede ser ms barato con su seguro), pero el sitio web puede darle el precio si no utiliz Research scientist (physical sciences).  - Puede imprimir el cupn correspondiente y llevarlo con su receta a la farmacia.  - Tambin puede pasar por nuestra oficina durante el horario de atencin regular y Charity fundraiser una tarjeta de cupones de GoodRx.  - Si necesita que su receta se enve electrnicamente a una farmacia diferente, informe a nuestra oficina a travs de MyChart de [place] o por telfono llamando al [phone] y presione la opcin 4.

## 2021-11-20 ENCOUNTER — Encounter: Payer: Self-pay | Admitting: Internal Medicine

## 2021-11-23 ENCOUNTER — Encounter: Payer: Self-pay | Admitting: Dermatology

## 2022-05-12 ENCOUNTER — Other Ambulatory Visit: Payer: Self-pay | Admitting: Internal Medicine

## 2022-05-12 DIAGNOSIS — F419 Anxiety disorder, unspecified: Secondary | ICD-10-CM

## 2022-05-13 NOTE — Telephone Encounter (Signed)
Requested medication (s) are due for refill today: yes  Requested medication (s) are on the active medication list: yes    Last refill: 09/08/21 #30  0 refills  Future visit scheduled yes 09/10/22  Notes to clinic:Not delegated, please review.  Requested Prescriptions  Pending Prescriptions Disp Refills   ALPRAZolam (XANAX) 0.25 MG tablet [Pharmacy Med Name: ALPRAZOLAM 0.25 MG TABLET] 30 tablet     Sig: TAKE TAB DAILY AS NEEDED     Not Delegated - Psychiatry: Anxiolytics/Hypnotics 2 Failed - 05/12/2022  2:48 PM      Failed - This refill cannot be delegated      Failed - Urine Drug Screen completed in last 360 days      Failed - Valid encounter within last 6 months    Recent Outpatient Visits           8 months ago Annual physical exam   [facility] Glean Hess, MD   1 year ago Annual physical exam   [facility] Glean Hess, MD   2 years ago Annual physical exam   [facility] Glean Hess, MD   3 years ago Annual physical exam   [facility] Glean Hess, MD   5 years ago Annual physical exam   [facility] Glean Hess, MD       Future Appointments             In 4 months Army Melia Jesse Sans, MD [facility] [place] - Patient is not pregnant

## 2022-08-12 ENCOUNTER — Ambulatory Visit: Payer: BC Managed Care – PPO | Admitting: Dermatology

## 2022-08-12 DIAGNOSIS — L814 Other melanin hyperpigmentation: Secondary | ICD-10-CM | POA: Diagnosis not present

## 2022-08-12 DIAGNOSIS — Z85828 Personal history of other malignant neoplasm of skin: Secondary | ICD-10-CM | POA: Diagnosis not present

## 2022-08-12 DIAGNOSIS — L570 Actinic keratosis: Secondary | ICD-10-CM | POA: Diagnosis not present

## 2022-08-12 DIAGNOSIS — I781 Nevus, non-neoplastic: Secondary | ICD-10-CM

## 2022-08-12 DIAGNOSIS — C44519 Basal cell carcinoma of skin of other part of trunk: Secondary | ICD-10-CM | POA: Diagnosis not present

## 2022-08-12 DIAGNOSIS — Z1283 Encounter for screening for malignant neoplasm of skin: Secondary | ICD-10-CM | POA: Diagnosis not present

## 2022-08-12 DIAGNOSIS — L578 Other skin changes due to chronic exposure to nonionizing radiation: Secondary | ICD-10-CM

## 2022-08-12 DIAGNOSIS — C4491 Basal cell carcinoma of skin, unspecified: Secondary | ICD-10-CM

## 2022-08-12 DIAGNOSIS — D489 Neoplasm of uncertain behavior, unspecified: Secondary | ICD-10-CM

## 2022-08-12 DIAGNOSIS — D1801 Hemangioma of skin and subcutaneous tissue: Secondary | ICD-10-CM

## 2022-08-12 DIAGNOSIS — L821 Other seborrheic keratosis: Secondary | ICD-10-CM

## 2022-08-12 HISTORY — DX: Basal cell carcinoma of skin, unspecified: C44.91

## 2022-08-12 MED ORDER — FLUOROURACIL 5 % EX CREA: TOPICAL_CREAM | Freq: Two times a day (BID) | CUTANEOUS | 0 refills | Status: DC

## 2022-08-12 NOTE — Progress Notes (Signed)
Follow-Up Visit   Subjective  Erin Smith is a 61 y.o. female who presents for the following: Annual Exam (1 year tbse, hx of bcc, hx of aks, Reports rough spot at right cheek. Has used Efudex cream at past. ).  The patient presents for Total-Body Skin Exam (TBSE) for skin cancer screening and mole check.  The patient has spots, moles and lesions to be evaluated, some may be new or changing and the patient has concerns that these could be cancer.   The following portions of the chart were reviewed this encounter and updated as appropriate:  Tobacco  Allergies  Meds  Problems  Med Hx  Surg Hx  Fam Hx      Review of Systems: No other skin or systemic complaints except as noted in HPI or Assessment and Plan.   Objective  Well appearing patient in no apparent distress; mood and affect are within normal limits.  A full examination was performed including scalp, head, eyes, ears, nose, lips, neck, chest, axillae, abdomen, back, buttocks, bilateral upper extremities, bilateral lower extremities, hands, feet, fingers, toes, fingernails, and toenails. All findings within normal limits unless otherwise noted below.  left anterior ankle x 1 Erythematous thin papules/macules with gritty scale.   right abdomen 0.5 cm thin pink papule         Assessment & Plan  Actinic keratosis left anterior ankle x 1  Slightly hypertrophic AK at left anterior ankle  Actinic keratoses are precancerous spots that appear secondary to cumulative UV radiation exposure/sun exposure over time. They are chronic with expected duration over 1 year. A portion of actinic keratoses will progress to squamous cell carcinoma of the skin. It is not possible to reliably predict which spots will progress to skin cancer and so treatment is recommended to prevent development of skin cancer.  Recommend daily broad spectrum sunscreen SPF 30+ to sun-exposed areas, reapply every 2 hours as needed.  Recommend  staying in the shade or wearing long sleeves, sun glasses (UVA+UVB protection) and wide brim hats (4-inch brim around the entire circumference of the hat). Call for new or changing lesions.   Destruction of lesion - left anterior ankle x 1  Destruction method: cryotherapy   Informed consent: discussed and consent obtained   Lesion destroyed using liquid nitrogen: Yes   Cryotherapy cycles:  2 Outcome: patient tolerated procedure well with no complications   Post-procedure details: wound care instructions given    Related Medications fluorouracil (EFUDEX) 5 % cream Apply topically 2 (two) times daily. Apply to right cheek for 1 week  Neoplasm of uncertain behavior right abdomen  Skin / nail biopsy Type of biopsy: tangential   Informed consent: discussed and consent obtained   Timeout: patient name, date of birth, surgical site, and procedure verified   Patient was prepped and draped in usual sterile fashion: Area prepped with isopropyl alcohol. Anesthesia: the lesion was anesthetized in a standard fashion   Anesthetic:  1% lidocaine w/ epinephrine 1-100,000 buffered w/ 8.4% NaHCO3 Instrument used: flexible razor blade   Hemostasis achieved with: aluminum chloride   Outcome: patient tolerated procedure well   Post-procedure details: wound care instructions given   Additional details:  Mupirocin and a bandage applied  Specimen 1 - Surgical pathology Differential Diagnosis: r/o bcc   Check Margins: No  R/o bcc   Lentigines - Scattered tan macules - Due to sun exposure - Benign-appearing, observe - Recommend daily broad spectrum sunscreen SPF 30+ to sun-exposed areas, reapply every  2 hours as needed. - Call for any changes  Telangiectasia Right nose - Dilated blood vessel - Benign appearing on exam - Call for changes  Seborrheic Keratoses - Stuck-on, waxy, tan-brown papules and/or plaques  - Benign-appearing - Discussed benign etiology and prognosis. - Observe -  Call for any changes  Melanocytic Nevi - Tan-brown and/or pink-flesh-colored symmetric macules and papules - Benign appearing on exam today - Observation - Call clinic for new or changing moles - Recommend daily use of broad spectrum spf 30+ sunscreen to sun-exposed areas.   Hemangiomas - Red papules - Discussed benign nature - Observe - Call for any changes  Actinic Damage - Severe, confluent actinic changes with pre-cancerous actinic keratoses  - Severe, chronic, not at goal, secondary to cumulative UV radiation exposure over time - diffuse scaly erythematous macules and papules with underlying dyspigmentation - Discussed Prescription "Field Treatment" for Severe, Chronic Confluent Actinic Changes with Pre-Cancerous Actinic Keratoses Field treatment involves treatment of an entire area of skin that has confluent Actinic Changes (Sun/ Ultraviolet light damage) and PreCancerous Actinic Keratoses by method of PhotoDynamic Therapy (PDT) and/or prescription Topical Chemotherapy agents such as 5-fluorouracil, 5-fluorouracil/calcipotriene, and/or imiquimod.  The purpose is to decrease the number of clinically evident and subclinical PreCancerous lesions to prevent progression to development of skin cancer by chemically destroying early precancer changes that may or may not be visible.  It has been shown to reduce the risk of developing skin cancer in the treated area. As a result of treatment, redness, scaling, crusting, and open sores may occur during treatment course. One or more than one of these methods may be used and may have to be used several times to control, suppress and eliminate the PreCancerous changes. Discussed treatment course, expected reaction, and possible side effects. - Recommend daily broad spectrum sunscreen SPF 30+ to sun-exposed areas, reapply every 2 hours as needed.  - Staying in the shade or wearing long sleeves, sun glasses (UVA+UVB protection) and wide brim hats (4-inch  brim around the entire circumference of the hat) are also recommended. - Call for new or changing lesions.  Start 5-fluorouracilcream twice a day for 7 days to affected areas including right cheek. Prescription sent to [facility]  Reviewed course of treatment and expected reaction.  Patient advised to expect inflammation and crusting and advised that erosions are possible.  Patient advised to be diligent with sun protection during and after treatment. Counseled to keep medication out of reach of children and pets.  History of Basal Cell Carcinoma of the Skin - No evidence of recurrence today - Recommend regular full body skin exams - Recommend daily broad spectrum sunscreen SPF 30+ to sun-exposed areas, reapply every 2 hours as needed.  - Call if any new or changing lesions are noted between office visits  Skin cancer screening performed today. Return for 5 - 8 weeks aks recheck and ED&C , 1 year tbse. I, Ruthell Rummage, CMA, am acting as scribe for Forest Gleason, MD.  Documentation: I have reviewed the above documentation for accuracy and completeness, and I agree with the above.  Forest Gleason, MD

## 2022-08-12 NOTE — Patient Instructions (Addendum)
Start 5-fluorouracil cream twice a day for 7 days to affected areas including right cheek. Prescription sent to [facility]  Reviewed course of treatment and expected reaction.  Patient advised to expect inflammation and crusting and advised that erosions are possible.  Patient advised to be diligent with sun protection during and after treatment. Counseled to keep medication out of reach of children and pets.     5-Fluorouracil Patient Education   Actinic keratoses are the dry, red scaly spots on the skin caused by sun damage. A portion of these spots can turn into skin cancer with time, and treating them can help prevent development of skin cancer.   Treatment of these spots requires removal of the defective skin cells. There are various ways to remove actinic keratoses, including freezing with liquid nitrogen, treatment with creams, or treatment with a blue light procedure in the office.   5-fluorouracil cream is a topical cream used to treat actinic keratoses. It works by interfering with the growth of abnormal fast-growing skin cells, such as actinic keratoses. These cells peel off and are replaced by healthy ones.   5-fluorouracil/calcipotriene is a combination of the 5-fluorouracil cream with a vitamin D analog cream called calcipotriene. The calcipotriene alone does not treat actinic keratoses. However, when it is combined with 5-fluorouracil, it helps the 5-fluorouracil treat the actinic keratoses much faster so that the same results can be achieved with a much shorter treatment time.  INSTRUCTIONS FOR 5-FLUOROURACIL  5-fluorouracil/ cream typically only needs to be used for 4-7 days. A thin layer should be applied twice a day to the treatment areas recommended by your physician.   If your physician prescribed you separate tubes of 5-fluourouracil and calcipotriene, apply a thin layer of 5-fluorouracil followed by a thin layer of calcipotriene.   Avoid contact with your eyes,  nostrils, and mouth. Do not use 5-fluorouracil/calcipotriene cream on infected or open wounds.   You will develop redness, irritation and some crusting at areas where you have pre-cancer damage/actinic keratoses. IF YOU DEVELOP PAIN, BLEEDING, OR SIGNIFICANT CRUSTING, STOP THE TREATMENT EARLY - you have already gotten a good response and the actinic keratoses should clear up well.  Wash your hands after applying 5-fluorouracil 5% cream on your skin.   A moisturizer or sunscreen with a minimum SPF 30 should be applied each morning.   Once you have finished the treatment, you can apply a thin layer of Vaseline twice a day to irritated areas to soothe and calm the areas more quickly. If you experience significant discomfort, contact your physician.  For some patients it is necessary to repeat the treatment for best results.  SIDE EFFECTS: When using 5-fluorouracil cream, you may have mild irritation, such as redness, dryness, swelling, or a mild burning sensation. This usually resolves within 2 weeks. The more actinic keratoses you have, the more redness and inflammation you can expect during treatment. Eye irritation has been reported rarely. If this occurs, please let us know.  If you have any trouble using this cream, please call the office. If you have any other questions about this information, please do not hesitate to ask me before you leave the office.     Biopsy Wound Care Instructions  Leave the original bandage on for 24 hours if possible.  If the bandage becomes soaked or soiled before that time, it is OK to remove it and examine the wound.  A small amount of post-operative bleeding is normal.  If excessive bleeding occurs, remove the  bandage, place gauze over the site and apply continuous pressure (no peeking) over the area for 30 minutes. If this does not work, please call our clinic as soon as possible or page your doctor if it is after hours.   Once a day, cleanse the wound with  soap and water. It is fine to shower. If a thick crust develops you may use a Q-tip dipped into dilute hydrogen peroxide (mix 1:1 with water) to dissolve it.  Hydrogen peroxide can slow the healing process, so use it only as needed.    After washing, apply petroleum jelly (Vaseline) or an antibiotic ointment if your doctor prescribed one for you, followed by a bandage.    For best healing, the wound should be covered with a layer of ointment at all times. If you are not able to keep the area covered with a bandage to hold the ointment in place, this may mean re-applying the ointment several times a day.  Continue this wound care until the wound has healed and is no longer open.   Itching and mild discomfort is normal during the healing process. However, if you develop pain or severe itching, please call our office.   If you have any discomfort, you can take Tylenol (acetaminophen) or ibuprofen as directed on the bottle. (Please do not take these if you have an allergy to them or cannot take them for another reason).  Some redness, tenderness and white or yellow material in the wound is normal healing.  If the area becomes very sore and red, or develops a thick yellow-green material (pus), it may be infected; please notify us.    If you have stitches, return to clinic as directed to have the stitches removed. You will continue wound care for 2-3 days after the stitches are removed.   Wound healing continues for up to one year following surgery. It is not unusual to experience pain in the scar from time to time during the interval.  If the pain becomes severe or the scar thickens, you should notify the office.    A slight amount of redness in a scar is expected for the first six months.  After six months, the redness will fade and the scar will soften and fade.  The color difference becomes less noticeable with time.  If there are any problems, return for a post-op surgery check at your earliest  convenience.  To improve the appearance of the scar, you can use silicone scar gel, cream, or sheets (such as Mederma or Serica) every night for up to one year. These are available over the counter (without a prescription).  Please call our office at [phone] for any questions or concerns.     Actinic keratoses are precancerous spots that appear secondary to cumulative UV radiation exposure/sun exposure over time. They are chronic with expected duration over 1 year. A portion of actinic keratoses will progress to squamous cell carcinoma of the skin. It is not possible to reliably predict which spots will progress to skin cancer and so treatment is recommended to prevent development of skin cancer.  Recommend daily broad spectrum sunscreen SPF 30+ to sun-exposed areas, reapply every 2 hours as needed.  Recommend staying in the shade or wearing long sleeves, sun glasses (UVA+UVB protection) and wide brim hats (4-inch brim around the entire circumference of the hat). Call for new or changing lesions.   Cryotherapy Aftercare  Wash gently with soap and water everyday.   Apply Vaseline and Band-Aid  daily until healed.    Melanoma ABCDEs  Melanoma is the most dangerous type of skin cancer, and is the leading cause of death from skin disease.  You are more likely to develop melanoma if you: Have light-colored skin, light-colored eyes, or red or blond hair Spend a lot of time in the sun Tan regularly, either outdoors or in a tanning bed Have had blistering sunburns, especially during childhood Have a close family member who has had a melanoma Have atypical moles or large birthmarks  Early detection of melanoma is key since treatment is typically straightforward and cure rates are extremely high if we catch it early.   The first sign of melanoma is often a change in a mole or a new dark spot.  The ABCDE system is a way of remembering the signs of melanoma.  A for asymmetry:  The two  halves do not match. B for border:  The edges of the growth are irregular. C for color:  A mixture of colors are present instead of an even brown color. D for diameter:  Melanomas are usually (but not always) greater than 81m - the size of a pencil eraser. E for evolution:  The spot keeps changing in size, shape, and color.  Please check your skin once per month between visits. You can use a small mirror in front and a large mirror behind you to keep an eye on the back side or your body.   If you see any new or changing lesions before your next follow-up, please call to schedule a visit.  Please continue daily skin protection including broad spectrum sunscreen SPF 30+ to sun-exposed areas, reapplying every 2 hours as needed when you're outdoors.   Staying in the shade or wearing long sleeves, sun glasses (UVA+UVB protection) and wide brim hats (4-inch brim around the entire circumference of the hat) are also recommended for sun protection.    Due to recent changes in healthcare laws, you may see results of your pathology and/or laboratory studies on MyChart before the doctors have had a chance to review them. We understand that in some cases there may be results that are confusing or concerning to you. Please understand that not all results are received at the same time and often the doctors may need to interpret multiple results in order to provide you with the best plan of care or course of treatment. Therefore, we ask that you please give u[place]2 business days to thoroughly review all your results before contacting the office for clarification. Should we see a critical lab result, you will be contacted sooner.   If You Need Anything After Your Visit  If you have any questions or concerns for your doctor, please call our main line at [phone] press option 4 to reach your doctor's medical assistant. If no one answers, please leave a voicemail as directed and we will return your call as soon  as possible. Messages left after 4 pm will be answered the following business day.   You may also send [place] message via [place] We typically respond to MyChart messages within 1-2 business days.  For prescription refills, please ask your pharmacy to contact our office. Our fax number is [phone]  If you have an urgent issue when the clinic is closed that cannot wait until the next business day, you can page your doctor at the number below.    Please note that while we do our best to be available for  urgent issues outside of office hours, we are not available 24/7.   If you have an urgent issue and are unable to reach [place], you may choose to seek medical care at your doctor's office, retail clinic, urgent care center, or emergency room.  If you have a medical emergency, please immediately call 911 or go to the emergency department.  Pager Numbers  - Dr. Nehemiah Massed: [phone]  - Dr. Laurence Ferrari: [phone]  - Dr. Nicole Kindred: [phone]  In the event of inclement weather, please call our main line at [phone] for an update on the status of any delays or closures.  Dermatology Medication Tips: Please keep the boxes that topical medications come in in order to help keep track of the instructions about where and how to use these. Pharmacies typically print the medication instructions only on the boxes and not directly on the medication tubes.   If your medication is too expensive, please contact our office at [phone] option 4 or send [place] a message through [place].   We are unable to tell what your co-pay for medications will be in advance as this is different depending on your insurance coverage. However, we may be able to find a substitute medication at lower cost or fill out paperwork to get insurance to cover a needed medication.   If a prior authorization is required to get your medication covered by your insurance company, please allow [place] 1-2 business days to complete this  process.  Drug prices often vary depending on where the prescription is filled and some pharmacies may offer cheaper prices.  The website www.goodrx.com contains coupons for medications through different pharmacies. The prices here do not account for what the cost may be with help from insurance (it may be cheaper with your insurance), but the website can give you the price if you did not use any insurance.  - You can print the associated coupon and take it with your prescription to the pharmacy.  - You may also stop by our office during regular business hours and pick up a GoodRx coupon card.  - If you need your prescription sent electronically to a different pharmacy, notify our office through [facility] or by phone at [phone] option 4.     Si Usted Necesita Algo Despus de Su Visita  Tambin puede enviarnos un mensaje a travs de Pharmacist, community. Por lo general respondemos a los mensajes de MyChart en el transcurso de 1 a 2 das hbiles.  Para renovar recetas, por favor pida a su farmacia que se ponga en contacto con nuestra oficina. Harland Dingwall de fax es [place] [phone].  Si tiene un asunto urgente cuando la clnica est cerrada y que no puede esperar hasta el siguiente da hbil, puede llamar/localizar a su doctor(a) al nmero que aparece a continuacin.   Por favor, tenga en cuenta que aunque hacemos todo lo posible para estar disponibles para asuntos urgentes fuera del horario de [place], no estamos disponibles las 24 horas del da, los 7 das de la [place].   Si tiene un problema urgente y no puede comunicarse con nosotros, puede optar por buscar atencin mdica  en el consultorio de su doctor(a), en una clnica privada, en un centro de atencin urgente o en una sala de emergencias.  Si tiene Engineering geologist, por favor llame inmediatamente al 911 o vaya a la sala de emergencias.  Nmeros de bper  - Dr. Nehemiah Massed: [phone]  - Dra. Moye: [phone]  - Dra.  Nicole Kindred: [phone]  En caso de inclemencias del [place], por favor llame a nuestra lnea principal al [phone] para una actualizacin sobre el [place] de cualquier retraso o cierre.  Consejos para la medicacin en dermatologa: Por favor, guarde las cajas en las que vienen los medicamentos de uso tpico para ayudarle a seguir las instrucciones sobre dnde y cmo usarlos. Las farmacias generalmente imprimen las instrucciones del medicamento slo en las cajas y no directamente en los tubos del [place].   Si su medicamento es muy caro, por favor, pngase en contacto con Zigmund Daniel llamando al [phone] y presione la opcin 4 o envenos un mensaje a travs de Pharmacist, community.   No podemos decirle cul ser su copago por los medicamentos por adelantado ya que esto es diferente dependiendo de la cobertura de su seguro. Sin embargo, es posible que podamos encontrar un medicamento sustituto a Electrical engineer un formulario para que el seguro cubra el medicamento que se considera necesario.   Si se requiere una autorizacin previa para que su compaa de seguros [place] su medicamento, por favor permtanos de 1 a 2 das hbiles para completar este proceso.  Los precios de los medicamentos varan con frecuencia dependiendo del Environmental consultant de dnde se surte la receta y alguna farmacias pueden ofrecer precios ms baratos.  El sitio web www.goodrx.com tiene cupones para medicamentos de Airline pilot. Los precios aqu no tienen en cuenta lo que podra costar con la ayuda del seguro (puede ser ms barato con su seguro), pero el sitio web puede darle el precio si no utiliz Research scientist (physical sciences).  - Puede imprimir el cupn correspondiente y llevarlo con su receta a la farmacia.  - Tambin puede pasar por nuestra oficina durante el horario de atencin regular y Charity fundraiser una tarjeta de cupones de GoodRx.  - Si necesita que su receta se enve electrnicamente a una farmacia diferente, informe a nuestra oficina a  travs de MyChart de [place] o por telfono llamando al [phone] y presione la opcin 4.

## 2022-08-14 ENCOUNTER — Encounter: Payer: Self-pay | Admitting: Dermatology

## 2022-08-18 ENCOUNTER — Telehealth: Payer: Self-pay

## 2022-08-18 NOTE — Telephone Encounter (Addendum)
Called patient regarding bx results. She verbalized understanding and is ok doing ED&C at next follow up in November.  She denied questions at this time.    ----- Message from Alfonso Patten, MD sent at 08/18/2022 11:53 AM EDT ----- Skin , right abdomen BASAL CELL CARCINOMA, SUPERFICIAL AND NODULAR PATTERNS --> ED&C at follow-up  MAs please call. Thank you!

## 2022-09-10 ENCOUNTER — Other Ambulatory Visit: Payer: Self-pay | Admitting: Internal Medicine

## 2022-09-10 ENCOUNTER — Ambulatory Visit (INDEPENDENT_AMBULATORY_CARE_PROVIDER_SITE_OTHER): Payer: BC Managed Care – PPO | Admitting: Internal Medicine

## 2022-09-10 ENCOUNTER — Encounter: Payer: Self-pay | Admitting: Internal Medicine

## 2022-09-10 VITALS — BP 102/60 | HR 75 | Ht 70.0 in | Wt 173.6 lb

## 2022-09-10 DIAGNOSIS — K219 Gastro-esophageal reflux disease without esophagitis: Secondary | ICD-10-CM | POA: Diagnosis not present

## 2022-09-10 DIAGNOSIS — Z0000 Encounter for general adult medical examination without abnormal findings: Secondary | ICD-10-CM

## 2022-09-10 DIAGNOSIS — E785 Hyperlipidemia, unspecified: Secondary | ICD-10-CM

## 2022-09-10 DIAGNOSIS — R7303 Prediabetes: Secondary | ICD-10-CM

## 2022-09-10 DIAGNOSIS — G43909 Migraine, unspecified, not intractable, without status migrainosus: Secondary | ICD-10-CM

## 2022-09-10 DIAGNOSIS — F419 Anxiety disorder, unspecified: Secondary | ICD-10-CM

## 2022-09-10 MED ORDER — ESOMEPRAZOLE MAGNESIUM 40 MG PO CPDR: 40.0000 mg | DELAYED_RELEASE_CAPSULE | Freq: Every day | ORAL | 3 refills | Status: DC

## 2022-09-10 MED ORDER — ALPRAZOLAM 0.25 MG PO TABS: ORAL_TABLET | ORAL | 0 refills | Status: DC

## 2022-09-10 MED ORDER — PROPRANOLOL HCL 20 MG PO TABS: 20.0000 mg | ORAL_TABLET | Freq: Two times a day (BID) | ORAL | 3 refills | Status: DC
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
## 2022-09-10 NOTE — Telephone Encounter (Signed)
Requested Prescriptions  Pending Prescriptions Disp Refills   esomeprazole (NEXIUM) 40 MG capsule [Pharmacy Med Name: ESOMEPRAZOLE MAG DR 40 MG CAP] 90 capsule 0    Sig: TAKE 1 CAPSULE BY MOUTH EVERY DAY     Gastroenterology: Proton Pump Inhibitors 2 Failed - 09/10/2022  2:41 AM      Failed - ALT in normal range and within 360 days    ALT  Date Value Ref Range Status  09/08/2021 25 0 - 32 IU/L Final         Failed - AST in normal range and within 360 days    AST  Date Value Ref Range Status  09/08/2021 37 0 - 40 IU/L Final         Failed - Valid encounter within last 12 months    Recent Outpatient Visits           1 year ago Annual physical exam   [facility] at [facility], Jesse Sans, MD   2 years ago Annual physical exam   [place] [facility] at [facility], Jesse Sans, MD   3 years ago Annual physical exam   [place] [facility] at [facility], Jesse Sans, MD   4 years ago Annual physical exam   [facility] at [facility], Jesse Sans, MD   5 years ago Annual physical exam   [facility] at [facility], Jesse Sans, MD       Future Appointments             Today Glean Hess, MD [facility] at [place], [facility]   In 1 week [facility], [place], [place]

## 2022-09-11 ENCOUNTER — Telehealth: Payer: Self-pay | Admitting: *Deleted

## 2022-09-11 LAB — COMPREHENSIVE METABOLIC PANEL
ALT: 17 IU/L (ref 0–32)
AST: 21 IU/L (ref 0–40)
Albumin/Globulin Ratio: 2 (ref 1.2–2.2)
Albumin: 4.9 g/dL (ref 3.8–4.9)
Alkaline Phosphatase: 72 IU/L (ref 44–121)
BUN/Creatinine Ratio: 10 (ref 9–23)
BUN: 9 mg/dL (ref 6–24)
Bilirubin Total: 0.3 mg/dL (ref 0.0–1.2)
CO2: 24 mmol/L (ref 20–29)
Calcium: 10.5 mg/dL — ABNORMAL HIGH (ref 8.7–10.2)
Chloride: 100 mmol/L (ref 96–106)
Creatinine, Ser: 0.86 mg/dL (ref 0.57–1.00)
Globulin, Total: 2.5 g/dL (ref 1.5–4.5)
Glucose: 106 mg/dL — ABNORMAL HIGH (ref 70–99)
Potassium: 4.4 mmol/L (ref 3.5–5.2)
Sodium: 140 mmol/L (ref 134–144)
Total Protein: 7.4 g/dL (ref 6.0–8.5)
eGFR: 78 mL/min/{1.73_m2} (ref >59)

## 2022-09-11 LAB — LIPID PANEL
Chol/HDL Ratio: 4 ratio (ref 0.0–4.4)
Cholesterol, Total: 255 mg/dL — ABNORMAL HIGH (ref 100–199)
HDL: 63 mg/dL (ref >39)
LDL Chol Calc (NIH): 170 mg/dL — ABNORMAL HIGH (ref 0–99)
Triglycerides: 122 mg/dL (ref 0–149)
VLDL Cholesterol Cal: 22 mg/dL (ref 5–40)

## 2022-09-11 LAB — CBC WITH DIFFERENTIAL/PLATELET
Basophils Absolute: 0.1 10*3/uL (ref 0.0–0.2)
Basos: 1 %
EOS (ABSOLUTE): 0.1 10*3/uL (ref 0.0–0.4)
Eos: 2 %
Hematocrit: 37.4 % (ref 34.0–46.6)
Hemoglobin: 12.5 g/dL (ref 11.1–15.9)
Immature Grans (Abs): 0 10*3/uL (ref 0.0–0.1)
Immature Granulocytes: 0 %
Lymphocytes Absolute: 1.7 10*3/uL (ref 0.7–3.1)
Lymphs: 39 %
MCH: 29.6 pg (ref 26.6–33.0)
MCHC: 33.4 g/dL (ref 31.5–35.7)
MCV: 89 fL (ref 79–97)
Monocytes Absolute: 0.3 10*3/uL (ref 0.1–0.9)
Monocytes: 7 %
Neutrophils Absolute: 2.2 10*3/uL (ref 1.4–7.0)
Neutrophils: 51 %
Platelets: 295 10*3/uL (ref 150–450)
RBC: 4.22 x10E6/uL (ref 3.77–5.28)
RDW: 12.4 % (ref 11.7–15.4)
WBC: 4.4 10*3/uL (ref 3.4–10.8)

## 2022-09-11 LAB — HEMOGLOBIN A1C
Est. average glucose Bld gHb Est-mCnc: 137 mg/dL
Hgb A1c MFr Bld: 6.4 % — ABNORMAL HIGH (ref 4.8–5.6)

## 2022-09-11 LAB — TSH: TSH: 1.27 u[IU]/mL (ref 0.450–4.500)

## 2022-09-11 NOTE — Telephone Encounter (Signed)
Patient informed we can do an A1C at visit and get results in office with a POCT test. She verbalized understanding.  - Erin Smith

## 2022-09-11 NOTE — Telephone Encounter (Signed)
Patient returned call and notified: Blood sugar is almost at the diabetic range.  Need to cut back on sweets/carbohydrates and lose about 5-10 lbs to prevent progression.  Cholesterol is still high as well.  May need to start medications. All other labs are normal.  Please follow up in 4 months for recheck.   Patient has scheduled March appointment- she wants to know since this is follow up can she come the week prior for labs. Advised I would ask provider.

## 2022-09-11 NOTE — Telephone Encounter (Signed)
May leave message on VM

## 2022-09-22 ENCOUNTER — Encounter: Payer: Self-pay | Admitting: Dermatology

## 2022-09-22 ENCOUNTER — Ambulatory Visit: Payer: BC Managed Care – PPO | Admitting: Dermatology

## 2022-09-22 DIAGNOSIS — Z872 Personal history of diseases of the skin and subcutaneous tissue: Secondary | ICD-10-CM | POA: Diagnosis not present

## 2022-09-22 DIAGNOSIS — C44519 Basal cell carcinoma of skin of other part of trunk: Secondary | ICD-10-CM

## 2022-09-22 DIAGNOSIS — L570 Actinic keratosis: Secondary | ICD-10-CM

## 2022-09-22 NOTE — Patient Instructions (Addendum)
Wound Care Instructions  Cleanse wound gently with soap and water once a day then pat dry with clean gauze. Apply a thin coat of Petrolatum (petroleum jelly, "Vaseline") over the wound (unless you have an allergy to this). We recommend that you use a new, sterile tube of Vaseline. Do not pick or remove scabs. Do not remove the yellow or white "healing tissue" from the base of the wound.  Cover the wound with fresh, clean, nonstick gauze and secure with paper tape. You may use Band-Aids in place of gauze and tape if the wound is small enough, but would recommend trimming much of the tape off as there is often too much. Sometimes Band-Aids can irritate the skin.  You should call the office for your biopsy report after 1 week if you have not already been contacted.  If you experience any problems, such as abnormal amounts of bleeding, swelling, significant bruising, significant pain, or evidence of infection, please call the office immediately.  FOR ADULT SURGERY PATIENTS: If you need something for pain relief you may take 1 extra strength Tylenol (acetaminophen) AND 2 Ibuprofen (200mg each) together every 4 hours as needed for pain. (do not take these if you are allergic to them or if you have a reason you should not take them.) Typically, you may only need pain medication for 1 to 3 days.     Due to recent changes in healthcare laws, you may see results of your pathology and/or laboratory studies on MyChart before the doctors have had a chance to review them. We understand that in some cases there may be results that are confusing or concerning to you. Please understand that not all results are received at the same time and often the doctors may need to interpret multiple results in order to provide you with the best plan of care or course of treatment. Therefore, we ask that you please give us 2 business days to thoroughly review all your results before contacting the office for clarification. Should  we see a critical lab result, you will be contacted sooner.   If You Need Anything After Your Visit  If you have any questions or concerns for your doctor, please call our main line at [phone] and press option 4 to reach your doctor's medical assistant. If no one answers, please leave a voicemail as directed and we will return your call as soon as possible. Messages left after 4 pm will be answered the following business day.   You may also send us a message via MyChart. We typically respond to MyChart messages within 1-2 business days.  For prescription refills, please ask your pharmacy to contact our office. Our fax number is [phone].  If you have an urgent issue when the clinic is closed that cannot wait until the next business day, you can page your doctor at the number below.    Please note that while we do our best to be available for urgent issues outside of office hours, we are not available 24/7.   If you have an urgent issue and are unable to reach us, you may choose to seek medical care at your doctor's office, retail clinic, urgent care center, or emergency room.  If you have a medical emergency, please immediately call 911 or go to the emergency department.  Pager Numbers  - Dr. Kowalski: [phone]  - Dr. Moye: [phone]  - Dr. Stewart: [phone]  In the event of inclement weather, please call our main line at   [phone] for an update on the status of any delays or closures.  Dermatology Medication Tips: Please keep the boxes that topical medications come in in order to help keep track of the instructions about where and how to use these. Pharmacies typically print the medication instructions only on the boxes and not directly on the medication tubes.   If your medication is too expensive, please contact our office at [phone] option 4 or send us a message through MyChart.   We are unable to tell what your co-pay for medications will be in  advance as this is different depending on your insurance coverage. However, we may be able to find a substitute medication at lower cost or fill out paperwork to get insurance to cover a needed medication.   If a prior authorization is required to get your medication covered by your insurance company, please allow us 1-2 business days to complete this process.  Drug prices often vary depending on where the prescription is filled and some pharmacies may offer cheaper prices.  The website www.goodrx.com contains coupons for medications through different pharmacies. The prices here do not account for what the cost may be with help from insurance (it may be cheaper with your insurance), but the website can give you the price if you did not use any insurance.  - You can print the associated coupon and take it with your prescription to the pharmacy.  - You may also stop by our office during regular business hours and pick up a GoodRx coupon card.  - If you need your prescription sent electronically to a different pharmacy, notify our office through [facility] MyChart or by phone at [phone] option 4.     Si Usted Necesita Algo Despus de Su Visita  Tambin puede enviarnos un mensaje a travs de MyChart. Por lo general respondemos a los mensajes de MyChart en el transcurso de 1 a 2 das hbiles.  Para renovar recetas, por favor pida a su farmacia que se ponga en contacto con nuestra oficina. Nuestro nmero de fax es el [phone].  Si tiene un asunto urgente cuando la clnica est cerrada y que no puede esperar hasta el siguiente da hbil, puede llamar/localizar a su doctor(a) al nmero que aparece a continuacin.   Por favor, tenga en cuenta que aunque hacemos todo lo posible para estar disponibles para asuntos urgentes fuera del horario de oficina, no estamos disponibles las 24 horas del da, los 7 das de la semana.   Si tiene un problema urgente y no puede comunicarse con nosotros, puede  optar por buscar atencin mdica  en el consultorio de su doctor(a), en una clnica privada, en un centro de atencin urgente o en una sala de emergencias.  Si tiene una emergencia mdica, por favor llame inmediatamente al 911 o vaya a la sala de emergencias.  Nmeros de bper  - Dr. Kowalski: [phone]  - Dra. Moye: [phone]  - Dra. Stewart: [phone]  En caso de inclemencias del tiempo, por favor llame a nuestra lnea principal al [phone] para una actualizacin sobre el estado de cualquier retraso o cierre.  Consejos para la medicacin en dermatologa: Por favor, guarde las cajas en las que vienen los medicamentos de uso tpico para ayudarle a seguir las instrucciones sobre dnde y cmo usarlos. Las farmacias generalmente imprimen las instrucciones del medicamento slo en las cajas y no directamente en los tubos del medicamento.   Si su medicamento es muy caro, por favor, pngase en contacto con   nuestra oficina llamando al [phone] y presione la opcin 4 o envenos un mensaje a travs de MyChart.   No podemos decirle cul ser su copago por los medicamentos por adelantado ya que esto es diferente dependiendo de la cobertura de su seguro. Sin embargo, es posible que podamos encontrar un medicamento sustituto a menor costo o llenar un formulario para que el seguro cubra el medicamento que se considera necesario.   Si se requiere una autorizacin previa para que su compaa de seguros cubra su medicamento, por favor permtanos de 1 a 2 das hbiles para completar este proceso.  Los precios de los medicamentos varan con frecuencia dependiendo del lugar de dnde se surte la receta y alguna farmacias pueden ofrecer precios ms baratos.  El sitio web www.goodrx.com tiene cupones para medicamentos de diferentes farmacias. Los precios aqu no tienen en cuenta lo que podra costar con la ayuda del seguro (puede ser ms barato con su seguro), pero el sitio web puede darle el  precio si no utiliz ningn seguro.  - Puede imprimir el cupn correspondiente y llevarlo con su receta a la farmacia.  - Tambin puede pasar por nuestra oficina durante el horario de atencin regular y recoger una tarjeta de cupones de GoodRx.  - Si necesita que su receta se enve electrnicamente a una farmacia diferente, informe a nuestra oficina a travs de MyChart de [place] o por telfono llamando al [phone] y presione la opcin 4.  

## 2022-09-22 NOTE — Progress Notes (Signed)
   Follow-Up Visit   Subjective  Erin Smith is a 61 y.o. female who presents for the following: Follow-up (6 weeks f/u on precancer on the right cheek, treated with 5FU/Calcipotriene cream twice a day x 7 days.) and Skin Cancer (Patient here for treatment of biopsy proven BCC at right abdomen /).   The following portions of the chart were reviewed this encounter and updated as appropriate:   Tobacco  Allergies  Meds  Problems  Med Hx  Surg Hx  Fam Hx      Review of Systems:  No other skin or systemic complaints except as noted in HPI or Assessment and Plan.  Objective  Well appearing patient in no apparent distress; mood and affect are within normal limits.  A focused examination was performed including abdomen,face. Relevant physical exam findings are noted in the Assessment and Plan.  right abdomen Well healed scar   Head - Anterior (Face) Pink patch, no sign of recurrence     Assessment & Plan  Basal cell carcinoma (BCC) of skin of trunk right abdomen  Destruction of lesion  Destruction method: electrodesiccation and curettage   Informed consent: discussed and consent obtained   Timeout:  patient name, date of birth, surgical site, and procedure verified Anesthesia: the lesion was anesthetized in a standard fashion   Anesthetic:  1% lidocaine w/ epinephrine 1-100,000 buffered w/ 8.4% NaHCO3 Curettage performed in three different directions: Yes   Electrodesiccation performed over the curetted area: Yes   Curettage cycles:  3 Final wound size (cm):  0.7 Hemostasis achieved with:  electrodesiccation Outcome: patient tolerated procedure well with no complications   Post-procedure details: sterile dressing applied and wound care instructions given   Dressing type: petrolatum    History of actinic keratoses Head - Anterior (Face)  S/p 5FU/Calcipotriene cream treatment. Monitor for recurrence   Actinic keratoses are precancerous spots that appear  secondary to cumulative UV radiation exposure/sun exposure over time. They are chronic with expected duration over 1 year. A portion of actinic keratoses will progress to squamous cell carcinoma of the skin. It is not possible to reliably predict which spots will progress to skin cancer and so treatment is recommended to prevent development of skin cancer.  Recommend daily broad spectrum sunscreen SPF 30+ to sun-exposed areas, reapply every 2 hours as needed.  Recommend staying in the shade or wearing long sleeves, sun glasses (UVA+UVB protection) and wide brim hats (4-inch brim around the entire circumference of the hat). Call for new or changing lesions.    Return in about 6 months (around 03/23/2023) for TBSE, hx of BCC, recheck AKs .  I, Marye Round, CMA, am acting as scribe for Forest Gleason, MD .   Documentation: I have reviewed the above documentation for accuracy and completeness, and I agree with the above.  Forest Gleason, MD

## 2022-09-29 ENCOUNTER — Encounter: Payer: Self-pay | Admitting: Dermatology

## 2022-11-06 ENCOUNTER — Telehealth: Payer: Self-pay

## 2022-11-09 NOTE — Telephone Encounter (Signed)
error 

## 2022-11-27 ENCOUNTER — Ambulatory Visit: Payer: Self-pay | Admitting: *Deleted

## 2022-11-27 NOTE — Telephone Encounter (Signed)
Reason for Disposition  [1] COVID-19 diagnosed by positive lab test (e.g., PCR, rapid self-test kit) AND [2] mild symptoms (e.g., cough, fever, others) AND [9] no complications or SOB  Answer Assessment - Initial Assessment Questions 1. COVID-19 DIAGNOSIS: "How do you know that you have COVID?" (e.g., positive lab test or self-test, diagnosed by doctor or NP/PA, symptoms after exposure).     Positive for Covid 2. COVID-19 EXPOSURE: "Was there any known exposure to COVID before the symptoms began?" CDC Definition of close contact: within 6 feet (2 meters) for a total of 15 minutes or more over a 24-hour period.      Not asked 3. ONSET: "When did the COVID-19 symptoms start?"      Yesterday morning.   99.5 was my fever.   I took some Motrin and it helped.   I've got stuffy head, fever 100, body aches and nasal congestion. 4. WORST SYMPTOM: "What is your worst symptom?" (e.g., cough, fever, shortness of breath, muscle aches)     Not asked 5. COUGH: "Do you have a cough?" If Yes, ask: "How bad is the cough?"       No coughing  6. FEVER: "Do you have a fever?" If Yes, ask: "What is your temperature, how was it measured, and when did it start?"     100 today and 99.6 7. RESPIRATORY STATUS: "Describe your breathing?" (e.g., normal; shortness of breath, wheezing, unable to speak)      I had Covid in Sept. 2021.    No shortness of breath 8. BETTER-SAME-WORSE: "Are you getting better, staying the same or getting worse compared to yesterday?"  If getting worse, ask, "In what way?"     Not asked  9. OTHER SYMPTOMS: "Do you have any other symptoms?"  (e.g., chills, fatigue, headache, loss of smell or taste, muscle pain, sore throat)     Body aches, headache stuffy head, fever.    10. HIGH RISK DISEASE: "Do you have any chronic medical problems?" (e.g., asthma, heart or lung disease, weak immune system, obesity, etc.)       No    11. VACCINE: "Have you had the COVID-19 vaccine?" If Yes, ask: "Which one,  how many shots, when did you get it?"       Not asked  12. PREGNANCY: "Is there any chance you are pregnant?" "When was your last menstrual period?"       N/A due to age 70. O2 SATURATION MONITOR:  "Do you use an oxygen saturation monitor (pulse oximeter) at home?" If Yes, ask "What is your reading (oxygen level) today?" "What is your usual oxygen saturation reading?" (e.g., 95%)       Not asked  Protocols used: Coronavirus (COVID-19) Diagnosed or Suspected-A-AH

## 2022-11-27 NOTE — Telephone Encounter (Signed)
  Chief Complaint: Covid positive Symptoms: Body aches, fever, nasal congestion Frequency: started yesterday morning with a fever Pertinent Negatives: Patient denies coughing, shortness of breath, chest congestion. Disposition: '[]'$ ED /'[]'$ Urgent Care (no appt availability in office) / '[]'$ Appointment(In office/virtual)/ '[]'$  [facility]/ '[x]'$ Home Care/ '[]'$ Refused Recommended Disposition /'[]'$  Mobile Bus/ '[]'$  Follow-up with PCP Additional Notes: She did not want to start an antiviral.  I'll treat the symptoms which are mild with OTC medications.   That's what I did the last time I had Covid.  Went over the s/s to seek medical care for.

## 2023-01-11 ENCOUNTER — Ambulatory Visit: Payer: BC Managed Care – PPO | Admitting: Internal Medicine

## 2023-01-19 ENCOUNTER — Other Ambulatory Visit: Payer: Self-pay | Admitting: Internal Medicine

## 2023-01-20 ENCOUNTER — Ambulatory Visit: Payer: BC Managed Care – PPO | Admitting: Internal Medicine

## 2023-01-20 ENCOUNTER — Encounter: Payer: Self-pay | Admitting: Internal Medicine

## 2023-01-20 VITALS — BP 86/74 | HR 90 | Ht 70.0 in | Wt 154.0 lb

## 2023-01-20 DIAGNOSIS — F419 Anxiety disorder, unspecified: Secondary | ICD-10-CM

## 2023-01-20 DIAGNOSIS — R7303 Prediabetes: Secondary | ICD-10-CM

## 2023-01-20 DIAGNOSIS — E785 Hyperlipidemia, unspecified: Secondary | ICD-10-CM | POA: Diagnosis not present

## 2023-01-20 LAB — POCT GLYCOSYLATED HEMOGLOBIN (HGB A1C): Hemoglobin A1C: 6.4 % — AB (ref 4.0–5.6)

## 2023-01-20 MED ORDER — ALPRAZOLAM 0.25 MG PO TABS: ORAL_TABLET | ORAL | 0 refills | Status: DC

## 2023-01-20 NOTE — Assessment & Plan Note (Signed)
She has done great with diet changes and weight loss A1C today 6.4 - unchanged Continue current regimen, maintain weight loss Will recheck again in 6 months

## 2023-01-20 NOTE — Assessment & Plan Note (Signed)
Working with diet and exercise to avoid medications Will recheck again in November

## 2023-01-20 NOTE — Assessment & Plan Note (Signed)
Using Xanax as needed - 20 tabs last 6 months Son is coming home from [place] today and she is anxious to see him
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
## 2023-03-24 ENCOUNTER — Ambulatory Visit: Payer: BC Managed Care – PPO | Admitting: Dermatology

## 2023-03-24 DIAGNOSIS — X32XXXA Exposure to sunlight, initial encounter: Secondary | ICD-10-CM

## 2023-03-24 DIAGNOSIS — Z1283 Encounter for screening for malignant neoplasm of skin: Secondary | ICD-10-CM

## 2023-03-24 DIAGNOSIS — D1801 Hemangioma of skin and subcutaneous tissue: Secondary | ICD-10-CM

## 2023-03-24 DIAGNOSIS — L720 Epidermal cyst: Secondary | ICD-10-CM

## 2023-03-24 DIAGNOSIS — W908XXA Exposure to other nonionizing radiation, initial encounter: Secondary | ICD-10-CM

## 2023-03-24 DIAGNOSIS — L814 Other melanin hyperpigmentation: Secondary | ICD-10-CM | POA: Diagnosis not present

## 2023-03-24 DIAGNOSIS — L821 Other seborrheic keratosis: Secondary | ICD-10-CM

## 2023-03-24 DIAGNOSIS — L578 Other skin changes due to chronic exposure to nonionizing radiation: Secondary | ICD-10-CM

## 2023-03-24 DIAGNOSIS — Z85828 Personal history of other malignant neoplasm of skin: Secondary | ICD-10-CM

## 2023-03-24 NOTE — Patient Instructions (Addendum)
Recommend discussing with Dr. Judithann Graves if ok to take Niacinamide or Nicotinamide 500mg  twice per day to lower risk of non-melanoma skin cancer by approximately 25% but could possibly affect blood sugar. This is usually available at [employer].   Recommend taking Heliocare sun protection supplement daily in sunny weather for additional sun protection. For maximum protection on the sunniest days, you can take up to 2 capsules of regular Heliocare OR take 1 capsule of Heliocare Ultra. For prolonged exposure (such as a full day in the sun), you can repeat your dose of the supplement 4 hours after your first dose. Heliocare can be purchased at [employer], at some [facility] or at [email or web address].    Melanoma ABCDEs  Melanoma is the most dangerous type of skin cancer, and is the leading cause of death from skin disease.  You are more likely to develop melanoma if you: Have light-colored skin, light-colored eyes, or red or blond hair Spend a lot of time in the sun Tan regularly, either outdoors or in a tanning bed Have had blistering sunburns, especially during childhood Have a close family member who has had a melanoma Have atypical moles or large birthmarks  Early detection of melanoma is key since treatment is typically straightforward and cure rates are extremely high if we catch it early.   The first sign of melanoma is often a change in a mole or a new dark spot.  The ABCDE system is a way of remembering the signs of melanoma.  A for asymmetry:  The two halves do not match. B for border:  The edges of the growth are irregular. C for color:  A mixture of colors are present instead of an even brown color. D for diameter:  Melanomas are usually (but not always) greater than 6mm - the size of a pencil eraser. E for evolution:  The spot keeps changing in size, shape, and color.  Please check your skin once per month between visits. You can use a small mirror in front and a large  mirror behind you to keep an eye on the back side or your body.   If you see any new or changing lesions before your next follow-up, please call to schedule a visit.  Please continue daily skin protection including broad spectrum sunscreen SPF 30+ to sun-exposed areas, reapplying every 2 hours as needed when you're outdoors.    Due to recent changes in healthcare laws, you may see results of your pathology and/or laboratory studies on MyChart before the doctors have had a chance to review them. We understand that in some cases there may be results that are confusing or concerning to you. Please understand that not all results are received at the same time and often the doctors may need to interpret multiple results in order to provide you with the best plan of care or course of treatment. Therefore, we ask that you please give [place] 2 business days to thoroughly review all your results before contacting the office for clarification. Should we see a critical lab result, you will be contacted sooner.   If You Need Anything After Your Visit  If you have any questions or concerns for your doctor, please call our main line at [phone] and press option 4 to reach your doctor's medical assistant. If no one answers, please leave a voicemail as directed and we will return your call as soon as possible. Messages left after 4 pm will be answered the following business day.  You may also send [place] a message via MyChart. We typically respond to MyChart messages within 1-2 business days.  For prescription refills, please ask your pharmacy to contact our office. Our fax number is [phone].  If you have an urgent issue when the clinic is closed that cannot wait until the next business day, you can page your doctor at the number below.    Please note that while we do our best to be available for urgent issues outside of office hours, we are not available 24/7.   If you have an urgent issue and are unable to  reach [place], you may choose to seek medical care at your doctor's office, retail clinic, urgent care center, or emergency room.  If you have a medical emergency, please immediately call 911 or go to the emergency department.  Pager Numbers  - Dr. Gwen Pounds: [phone]  - Dr. Neale Burly: [phone]  - Dr. Roseanne Reno: [phone]  In the event of inclement weather, please call our main line at [phone] for an update on the status of any delays or closures.  Dermatology Medication Tips: Please keep the boxes that topical medications come in in order to help keep track of the instructions about where and how to use these. Pharmacies typically print the medication instructions only on the boxes and not directly on the medication tubes.   If your medication is too expensive, please contact our office at [phone] option 4 or send [place] a message through MyChart.   We are unable to tell what your co-pay for medications will be in advance as this is different depending on your insurance coverage. However, we may be able to find a substitute medication at lower cost or fill out paperwork to get insurance to cover a needed medication.   If a prior authorization is required to get your medication covered by your insurance company, please allow [place] 1-2 business days to complete this process.  Drug prices often vary depending on where the prescription is filled and some pharmacies may offer cheaper prices.  The website www.goodrx.com contains coupons for medications through different pharmacies. The prices here do not account for what the cost may be with help from insurance (it may be cheaper with your insurance), but the website can give you the price if you did not use any insurance.  - You can print the associated coupon and take it with your prescription to the pharmacy.  - You may also stop by our office during regular business hours and pick up a GoodRx coupon card.  - If you need your prescription  sent electronically to a different pharmacy, notify our office through [facility] or by phone at [phone] option 4.

## 2023-03-24 NOTE — Progress Notes (Signed)
   Follow-Up Visit   Subjective  Erin Smith is a 62 y.o. female who presents for the following: Skin Cancer Screening and Full Body Skin Exam  The patient presents for Total-Body Skin Exam (TBSE) for skin cancer screening and mole check. The patient has spots, moles and lesions to be evaluated, some may be new or changing and the patient has concerns that these could be cancer.    The following portions of the chart were reviewed this encounter and updated as appropriate: medications, allergies, medical history  Review of Systems:  No other skin or systemic complaints except as noted in HPI or Assessment and Plan.  Objective  Well appearing patient in no apparent distress; mood and affect are within normal limits.  A full examination was performed including scalp, head, eyes, ears, nose, lips, neck, chest, axillae, abdomen, back, buttocks, bilateral upper extremities, bilateral lower extremities, hands, feet, fingers, toes, fingernails, and toenails. All findings within normal limits unless otherwise noted below.   Relevant physical exam findings are noted in the Assessment and Plan.    Assessment & Plan   LENTIGINES, SEBORRHEIC KERATOSES, HEMANGIOMAS - Benign normal skin lesions - Benign-appearing - Call for any changes  MELANOCYTIC NEVI - Tan-brown and/or pink-flesh-colored symmetric macules and papules - Benign appearing on exam today - Observation - Call clinic for new or changing moles - Recommend daily use of broad spectrum spf 30+ sunscreen to sun-exposed areas.   ACTINIC DAMAGE - Chronic condition, secondary to cumulative UV/sun exposure - diffuse scaly erythematous macules with underlying dyspigmentation - Recommend daily broad spectrum sunscreen SPF 30+ to sun-exposed areas, reapply every 2 hours as needed.  - Staying in the shade or wearing long sleeves, sun glasses (UVA+UVB protection) and wide brim hats (4-inch brim around the entire circumference of the  hat) are also recommended for sun protection.  - Call for new or changing lesions.  SKIN CANCER SCREENING PERFORMED TODAY.  HISTORY OF BASAL CELL CARCINOMA OF THE SKIN - No evidence of recurrence today - Recommend regular full body skin exams - Recommend daily broad spectrum sunscreen SPF 30+ to sun-exposed areas, reapply every 2 hours as needed.  - Call if any new or changing lesions are noted between office visits  EPIDERMAL INCLUSION CYST Exam: Subcutaneous papule at right shoulder  Benign-appearing. Exam most consistent with an epidermal inclusion cyst. Discussed that a cyst is a benign growth that can grow over time and sometimes get irritated or inflamed. Recommend observation if it is not bothersome. Discussed option of surgical excision to remove it if it is growing, symptomatic, or other changes noted. Please call for new or changing lesions so they can be evaluated.     Return in about 6 months (around 09/24/2023) for TBSE, Hx BCC with Dr. Katrinka Blazing.  Anise Salvo, RMA, am acting as scribe for Darden Dates, MD .   Documentation: I have reviewed the above documentation for accuracy and completeness, and I agree with the above.  Darden Dates, MD

## 2023-05-24 LAB — HM MAMMOGRAPHY: HM Mammogram: NORMAL (ref 0–4)

## 2023-09-14 ENCOUNTER — Encounter: Payer: Self-pay | Admitting: Internal Medicine

## 2023-09-14 ENCOUNTER — Ambulatory Visit (INDEPENDENT_AMBULATORY_CARE_PROVIDER_SITE_OTHER): Payer: BC Managed Care – PPO | Admitting: Internal Medicine

## 2023-09-14 VITALS — BP 128/70 | HR 78 | Ht 70.0 in | Wt 149.0 lb

## 2023-09-14 DIAGNOSIS — Z0000 Encounter for general adult medical examination without abnormal findings: Secondary | ICD-10-CM | POA: Diagnosis not present

## 2023-09-14 DIAGNOSIS — E785 Hyperlipidemia, unspecified: Secondary | ICD-10-CM

## 2023-09-14 DIAGNOSIS — G43909 Migraine, unspecified, not intractable, without status migrainosus: Secondary | ICD-10-CM

## 2023-09-14 DIAGNOSIS — Z1231 Encounter for screening mammogram for malignant neoplasm of breast: Secondary | ICD-10-CM | POA: Diagnosis not present

## 2023-09-14 DIAGNOSIS — R7303 Prediabetes: Secondary | ICD-10-CM

## 2023-09-14 DIAGNOSIS — R002 Palpitations: Secondary | ICD-10-CM

## 2023-09-14 DIAGNOSIS — F419 Anxiety disorder, unspecified: Secondary | ICD-10-CM

## 2023-09-14 DIAGNOSIS — K219 Gastro-esophageal reflux disease without esophagitis: Secondary | ICD-10-CM

## 2023-09-14 MED ORDER — ESOMEPRAZOLE MAGNESIUM 40 MG PO CPDR: 40.0000 mg | DELAYED_RELEASE_CAPSULE | Freq: Every day | ORAL | 3 refills | Status: DC

## 2023-09-14 MED ORDER — ALPRAZOLAM 0.25 MG PO TABS: ORAL_TABLET | ORAL | 0 refills | Status: DC

## 2023-09-14 MED ORDER — PROPRANOLOL HCL 20 MG PO TABS: 20.0000 mg | ORAL_TABLET | Freq: Two times a day (BID) | ORAL | 3 refills | Status: DC

## 2023-09-14 NOTE — Assessment & Plan Note (Signed)
Managed with diet and exercise Lab Results  Component Value Date   LDLCALC 170 (H) 09/10/2022

## 2023-09-14 NOTE — Assessment & Plan Note (Signed)
Symptoms controlled with propranolol. No chest pain or shortness of breath.

## 2023-09-14 NOTE — Assessment & Plan Note (Signed)
Improving with diet change and weight loss.

## 2023-09-14 NOTE — Patient Instructions (Signed)
Call [facility] to schedule your annual mammogram in July 2025
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
## 2023-09-15 LAB — LIPID PANEL
Chol/HDL Ratio: 3.8 ratio (ref 0.0–4.4)
Cholesterol, Total: 263 mg/dL — ABNORMAL HIGH (ref 100–199)
HDL: 70 mg/dL (ref >39)
LDL Chol Calc (NIH): 173 mg/dL — ABNORMAL HIGH (ref 0–99)
Triglycerides: 117 mg/dL (ref 0–149)
VLDL Cholesterol Cal: 20 mg/dL (ref 5–40)

## 2023-09-15 LAB — CBC WITH DIFFERENTIAL/PLATELET
Basophils Absolute: 0.1 10*3/uL (ref 0.0–0.2)
Basos: 1 %
EOS (ABSOLUTE): 0.1 10*3/uL (ref 0.0–0.4)
Eos: 2 %
Hematocrit: 38.6 % (ref 34.0–46.6)
Hemoglobin: 12.7 g/dL (ref 11.1–15.9)
Immature Grans (Abs): 0 10*3/uL (ref 0.0–0.1)
Immature Granulocytes: 0 %
Lymphocytes Absolute: 2.2 10*3/uL (ref 0.7–3.1)
Lymphs: 46 %
MCH: 29.7 pg (ref 26.6–33.0)
MCHC: 32.9 g/dL (ref 31.5–35.7)
MCV: 90 fL (ref 79–97)
Monocytes Absolute: 0.3 10*3/uL (ref 0.1–0.9)
Monocytes: 7 %
Neutrophils Absolute: 2.1 10*3/uL (ref 1.4–7.0)
Neutrophils: 44 %
Platelets: 280 10*3/uL (ref 150–450)
RBC: 4.27 x10E6/uL (ref 3.77–5.28)
RDW: 12.3 % (ref 11.7–15.4)
WBC: 4.8 10*3/uL (ref 3.4–10.8)

## 2023-09-15 LAB — COMPREHENSIVE METABOLIC PANEL
ALT: 13 [IU]/L (ref 0–32)
AST: 17 [IU]/L (ref 0–40)
Albumin: 4.7 g/dL (ref 3.8–4.9)
Alkaline Phosphatase: 75 [IU]/L (ref 44–121)
BUN/Creatinine Ratio: 8 — ABNORMAL LOW (ref 12–28)
BUN: 7 mg/dL — ABNORMAL LOW (ref 8–27)
Bilirubin Total: 0.3 mg/dL (ref 0.0–1.2)
CO2: 25 mmol/L (ref 20–29)
Calcium: 10.2 mg/dL (ref 8.7–10.3)
Chloride: 99 mmol/L (ref 96–106)
Creatinine, Ser: 0.89 mg/dL (ref 0.57–1.00)
Globulin, Total: 2.4 g/dL (ref 1.5–4.5)
Glucose: 96 mg/dL (ref 70–99)
Potassium: 4.1 mmol/L (ref 3.5–5.2)
Sodium: 140 mmol/L (ref 134–144)
Total Protein: 7.1 g/dL (ref 6.0–8.5)
eGFR: 74 mL/min/{1.73_m2} (ref >59)

## 2023-09-15 LAB — HEMOGLOBIN A1C
Est. average glucose Bld gHb Est-mCnc: 128 mg/dL
Hgb A1c MFr Bld: 6.1 % — ABNORMAL HIGH (ref 4.8–5.6)

## 2023-09-15 LAB — TSH: TSH: 2.02 u[IU]/mL (ref 0.450–4.500)

## 2023-09-23 ENCOUNTER — Ambulatory Visit: Payer: BC Managed Care – PPO | Admitting: Dermatology

## 2023-09-23 ENCOUNTER — Encounter: Payer: Self-pay | Admitting: Dermatology

## 2023-09-23 DIAGNOSIS — L578 Other skin changes due to chronic exposure to nonionizing radiation: Secondary | ICD-10-CM | POA: Diagnosis not present

## 2023-09-23 DIAGNOSIS — D492 Neoplasm of unspecified behavior of bone, soft tissue, and skin: Secondary | ICD-10-CM | POA: Diagnosis not present

## 2023-09-23 DIAGNOSIS — L570 Actinic keratosis: Secondary | ICD-10-CM

## 2023-09-23 DIAGNOSIS — D1801 Hemangioma of skin and subcutaneous tissue: Secondary | ICD-10-CM

## 2023-09-23 DIAGNOSIS — Z1283 Encounter for screening for malignant neoplasm of skin: Secondary | ICD-10-CM

## 2023-09-23 DIAGNOSIS — D229 Melanocytic nevi, unspecified: Secondary | ICD-10-CM

## 2023-09-23 DIAGNOSIS — L821 Other seborrheic keratosis: Secondary | ICD-10-CM

## 2023-09-23 DIAGNOSIS — W908XXA Exposure to other nonionizing radiation, initial encounter: Secondary | ICD-10-CM | POA: Diagnosis not present

## 2023-09-23 DIAGNOSIS — L814 Other melanin hyperpigmentation: Secondary | ICD-10-CM

## 2023-09-23 DIAGNOSIS — Z85828 Personal history of other malignant neoplasm of skin: Secondary | ICD-10-CM

## 2023-09-23 DIAGNOSIS — Z872 Personal history of diseases of the skin and subcutaneous tissue: Secondary | ICD-10-CM

## 2023-09-23 NOTE — Patient Instructions (Addendum)
 Cryotherapy Aftercare  Wash gently with soap and water everyday.   Apply Vaseline and Band-Aid daily until healed.       Wound Care Instructions  Cleanse wound gently with soap and water once a day then pat dry with clean gauze. Apply a thin coat of Petrolatum (petroleum jelly, "Vaseline") over the wound (unless you have an allergy to this). We recommend that you use a new, sterile tube of Vaseline. Do not pick or remove scabs. Do not remove the yellow or white "healing tissue" from the base of the wound.  Cover the wound with fresh, clean, nonstick gauze and secure with paper tape. You may use Band-Aids in place of gauze and tape if the wound is small enough, but would recommend trimming much of the tape off as there is often too much. Sometimes Band-Aids can irritate the skin.  You should call the office for your biopsy report after 1 week if you have not already been contacted.  If you experience any problems, such as abnormal amounts of bleeding, swelling, significant bruising, significant pain, or evidence of infection, please call the office immediately.  FOR ADULT SURGERY PATIENTS: If you need something for pain relief you may take 1 extra strength Tylenol (acetaminophen) AND 2 Ibuprofen (200mg  each) together every 4 hours as needed for pain. (do not take these if you are allergic to them or if you have a reason you should not take them.) Typically, you may only need pain medication for 1 to 3 days.          Due to recent changes in healthcare laws, you may see results of your pathology and/or laboratory studies on MyChart before the doctors have had a chance to review them. We understand that in some cases there may be results that are confusing or concerning to you. Please understand that not all results are received at the same time and often the doctors may need to interpret multiple results in order to provide you with the best plan of care or course of treatment. Therefore,  we ask that you please give [place] 2 business days to thoroughly review all your results before contacting the office for clarification. Should we see a critical lab result, you will be contacted sooner.   If You Need Anything After Your Visit  If you have any questions or concerns for your doctor, please call our main line at [phone] and press option 4 to reach your doctor's medical assistant. If no one answers, please leave a voicemail as directed and we will return your call as soon as possible. Messages left after 4 pm will be answered the following business day.   You may also send [place] a message via MyChart. We typically respond to MyChart messages within 1-2 business days.  For prescription refills, please ask your pharmacy to contact our office. Our fax number is [phone].  If you have an urgent issue when the clinic is closed that cannot wait until the next business day, you can page your doctor at the number below.    Please note that while we do our best to be available for urgent issues outside of office hours, we are not available 24/7.   If you have an urgent issue and are unable to reach [place], you may choose to seek medical care at your doctor's office, retail clinic, urgent care center, or emergency room.  If you have a medical emergency, please immediately call 911 or go to the emergency department.  Pager Numbers  - Dr. Gwen Pounds: [phone]  - Dr. Roseanne Reno: [phone]  - Dr. Katrinka Blazing: [phone]   In the event of inclement weather, please call our main line at [phone] for an update on the status of any delays or closures.  Dermatology Medication Tips: Please keep the boxes that topical medications come in in order to help keep track of the instructions about where and how to use these. Pharmacies typically print the medication instructions only on the boxes and not directly on the medication tubes.   If your medication is too expensive, please contact our  office at [phone] option 4 or send [place] a message through MyChart.   We are unable to tell what your co-pay for medications will be in advance as this is different depending on your insurance coverage. However, we may be able to find a substitute medication at lower cost or fill out paperwork to get insurance to cover a needed medication.   If a prior authorization is required to get your medication covered by your insurance company, please allow [place] 1-2 business days to complete this process.  Drug prices often vary depending on where the prescription is filled and some pharmacies may offer cheaper prices.  The website www.goodrx.com contains coupons for medications through different pharmacies. The prices here do not account for what the cost may be with help from insurance (it may be cheaper with your insurance), but the website can give you the price if you did not use any insurance.  - You can print the associated coupon and take it with your prescription to the pharmacy.  - You may also stop by our office during regular business hours and pick up a GoodRx coupon card.  - If you need your prescription sent electronically to a different pharmacy, notify our office through [facility] or by phone at [phone] option 4.     Si Usted Necesita Algo Despus de Su Visita  Tambin puede enviarnos un mensaje a travs de Clinical cytogeneticist. Por lo general respondemos a los mensajes de MyChart en el transcurso de 1 a 2 das hbiles.  Para renovar recetas, por favor pida a su farmacia que se ponga en contacto con nuestra oficina. Annie Sable de fax es [place] [phone].  Si tiene un asunto urgente cuando la clnica est cerrada y que no puede esperar hasta el siguiente da hbil, puede llamar/localizar a su doctor(a) al nmero que aparece a continuacin.   Por favor, tenga en cuenta que aunque hacemos todo lo posible para estar disponibles para asuntos urgentes fuera del horario de [place], no  estamos disponibles las 24 horas del da, los 7 [address]  [address] de la [place].   Si tiene un problema urgente y no puede comunicarse con nosotros, puede optar por buscar atencin mdica  en el consultorio de su doctor(a), en una clnica privada, en un centro de atencin urgente o en una sala de emergencias.  Si tiene Engineer, drilling, por favor llame inmediatamente al 911 o vaya a la sala de emergencias.  Nmeros de bper  - Dr. Gwen Pounds: [phone]  - Dra. Roseanne Reno: [phone]  - Dr. Katrinka Blazing: [phone]   En caso de inclemencias del tiempo, por favor llame a Lacy Duverney principal al [phone] para una actualizacin sobre el [place] de cualquier retraso o cierre.  Consejos para la medicacin en dermatologa: Por favor, guarde las cajas en las que vienen los medicamentos de uso tpico para ayudarle a seguir las instrucciones sobre dnde y cmo usarlos. [employer]  generalmente imprimen las instrucciones del medicamento slo en las cajas y no directamente en los tubos del [place].   Si su medicamento es muy caro, por favor, pngase en contacto con Rolm Gala llamando al [phone] y presione la opcin 4 o envenos un mensaje a travs de Clinical cytogeneticist.   No podemos decirle cul ser su copago por los medicamentos por adelantado ya que esto es diferente dependiendo de la cobertura de su seguro. Sin embargo, es posible que podamos encontrar un medicamento sustituto a Audiological scientist un formulario para que el seguro cubra el medicamento que se considera necesario.   Si se requiere una autorizacin previa para que su compaa de seguros [place] su medicamento, por favor permtanos de 1 a 2 das hbiles para completar [address]  Los precios de los medicamentos varan con frecuencia dependiendo del Environmental consultant de dnde se surte la receta y alguna farmacias pueden ofrecer precios ms baratos.  El sitio web www.goodrx.com tiene cupones para medicamentos de Health and safety inspector. Los precios  aqu no tienen en cuenta lo que podra costar con la ayuda del seguro (puede ser ms barato con su seguro), pero el sitio web puede darle el precio si no utiliz Tourist information centre manager.  - Puede imprimir el cupn correspondiente y llevarlo con su receta a la farmacia.  - Tambin puede pasar por nuestra oficina durante el horario de atencin regular y Education officer, museum una tarjeta de cupones de GoodRx.  - Si necesita que su receta se enve electrnicamente a una farmacia diferente, informe a nuestra oficina a travs de MyChart de [place] o por telfono llamando al [phone] y presione la opcin 4.

## 2023-09-23 NOTE — Progress Notes (Signed)
Follow-Up Visit   Subjective  Chirstine Smith is a 62 y.o. female who presents for the following: Skin Cancer Screening and Full Body Skin Exam, hx of BCCs, AKs  The patient presents for Total-Body Skin Exam (TBSE) for skin cancer screening and mole check. The patient has spots, moles and lesions to be evaluated, some may be new or changing and the patient may have concern these could be cancer.    The following portions of the chart were reviewed this encounter and updated as appropriate: medications, allergies, medical history  Review of Systems:  No other skin or systemic complaints except as noted in HPI or Assessment and Plan.  Objective  Well appearing patient in no apparent distress; mood and affect are within normal limits.  A full examination was performed including scalp, head, eyes, ears, nose, lips, neck, chest, axillae, abdomen, back, buttocks, bilateral upper extremities, bilateral lower extremities, hands, feet, fingers, toes, fingernails, and toenails. All findings within normal limits unless otherwise noted below.   Exam of nails limited by presence of nail polish.   Relevant physical exam findings are noted in the Assessment and Plan.  R dorsal hand x 1 Pink scaly macules  anterior neck base 11.0 x 9.28mm circular pink macule         Assessment & Plan   SKIN CANCER SCREENING PERFORMED TODAY.  ACTINIC DAMAGE - Chronic condition, secondary to cumulative UV/sun exposure - diffuse scaly erythematous macules with underlying dyspigmentation - Recommend daily broad spectrum sunscreen SPF 30+ to sun-exposed areas, reapply every 2 hours as needed.  - Staying in the shade or wearing long sleeves, sun glasses (UVA+UVB protection) and wide brim hats (4-inch brim around the entire circumference of the hat) are also recommended for sun protection.  - Call for new or changing lesions.  LENTIGINES, SEBORRHEIC KERATOSES, HEMANGIOMAS - Benign normal skin  lesions - Benign-appearing - Call for any changes  MELANOCYTIC NEVI - Tan-brown and/or pink-flesh-colored symmetric macules and papules - Benign appearing on exam today - Observation - Call clinic for new or changing moles - Recommend daily use of broad spectrum spf 30+ sunscreen to sun-exposed areas.   HISTORY OF BASAL CELL CARCINOMA OF THE SKIN - No evidence of recurrence today - Recommend regular full body skin exams - Recommend daily broad spectrum sunscreen SPF 30+ to sun-exposed areas, reapply every 2 hours as needed.  - Call if any new or changing lesions are noted between office visits  - mid chest, R abdomen  CONGENITAL NEVUS Pt states bx in past benign L post thigh Exam: brown macule   Treatment Plan: Benign-appearing.  Observation.  Call clinic for new or changing moles.  Recommend daily use of broad spectrum spf 30+ sunscreen to sun-exposed areas.     AK (actinic keratosis) R dorsal hand x 1  Actinic keratoses are precancerous spots that appear secondary to cumulative UV radiation exposure/sun exposure over time. They are chronic with expected duration over 1 year. A portion of actinic keratoses will progress to squamous cell carcinoma of the skin. It is not possible to reliably predict which spots will progress to skin cancer and so treatment is recommended to prevent development of skin cancer.  Recommend daily broad spectrum sunscreen SPF 30+ to sun-exposed areas, reapply every 2 hours as needed.  Recommend staying in the shade or wearing long sleeves, sun glasses (UVA+UVB protection) and wide brim hats (4-inch brim around the entire circumference of the hat). Call for new or changing lesions.  Destruction of lesion - R dorsal hand x 1 Complexity: simple   Destruction method: cryotherapy   Informed consent: discussed and consent obtained   Timeout:  patient name, date of birth, surgical site, and procedure verified Lesion destroyed using liquid nitrogen: Yes    Region frozen until ice ball extended beyond lesion: Yes   Cryo cycles: 1 or 2. Outcome: patient tolerated procedure well with no complications   Post-procedure details: wound care instructions given    Neoplasm of skin anterior neck base  Skin / nail biopsy Type of biopsy: tangential   Informed consent: discussed and consent obtained   Timeout: patient name, date of birth, surgical site, and procedure verified   Procedure prep:  Patient was prepped and draped in usual sterile fashion Prep type:  Isopropyl alcohol Anesthesia: the lesion was anesthetized in a standard fashion   Anesthetic:  1% lidocaine w/ epinephrine 1-100,000 buffered w/ 8.4% NaHCO3 Instrument used: DermaBlade   Hemostasis achieved with: pressure and aluminum chloride   Outcome: patient tolerated procedure well   Post-procedure details: sterile dressing applied and wound care instructions given   Dressing type: bandage and petrolatum    Specimen 1 - Surgical pathology Differential Diagnosis: R/O BCC  Check Margins: No 11.0 x 9.7mm circular pink macule    Multiple benign nevi  Actinic elastosis  Lentigines  Seborrheic keratoses  Cherry angioma   Return in about 1 year (around 09/22/2024) for TBSE, Hx of AKs.  I, Erin Smith, RMA, am acting as scribe for Elie Goody, MD .   Documentation: I have reviewed the above documentation for accuracy and completeness, and I agree with the above.  Elie Goody, MD

## 2023-09-27 LAB — SURGICAL PATHOLOGY

## 2023-09-28 ENCOUNTER — Telehealth: Payer: Self-pay

## 2023-09-28 NOTE — Telephone Encounter (Signed)
-----   Message from Clymer sent at 09/27/2023  8:51 PM EST ----- Diagnosis: anterior neck base :       SUPERFICIAL BASAL CELL CARCINOMA   Please call to share diagnosis and discuss treatment options. Please message me with patient's choice and schedule ED&C or prescribe imiquimod with 3 month follow up for recheck to ensure clearance (assuming patient completes treatment course)  Explanation: your biopsy shows a basal cell skin cancer limited to the top layer of skin. This is the most common kind of skin cancer and is caused by damage from sun exposure. Basal cell skin cancers almost never spread beyond the skin, so they are not dangerous to your overall health. However, they will continue to grow, can bleed, cause nonhealing wounds, and disrupt nearby structures unless fully treated.  Treatment option 1: you return for a brief appointment where I perform electrodesiccation and curettage [facility]). This involves three rounds of scraping and burning to destroy the skin cancer. It has about an 85% cure rate and leaves a round wound slightly larger than the skin cancer and leaves a round white scar. No additional pathology is done. If the skin cancer comes back, we would need to do a surgery to remove it.   Treatment option 2: imiquimod is a cream that helps your immune system clear the skin cancer. You will apply the cream on weekdays for 6 weeks. If redness and irritation develop, take 3 days off before restarting. If an open sore develops, stop the cream and send [place] a message on MyChart or call us.  For option 2: Free text instructions and copy paste: "apply cream to your skin cancer once daily on 5 days per week, prior to normal sleeping hours, for 6 weeks. Leave it on your skin for ~8 hours, then remove with mild soap and water. Apply enough cream to cover the treatment area and a quarter inch of skin surrounding the tumor. If redness and irritation develop, take 3 days off then restart."  Prescribe 12 packets with 2 refills.

## 2023-09-28 NOTE — Telephone Encounter (Signed)
Discussed pathology results. Patient voiced understanding. Discussed treatment options: EDC vs topical Imiquimod. Patient prefers EDC. Scheduled for 11/09/2022.

## 2023-11-10 ENCOUNTER — Encounter: Payer: Self-pay | Admitting: Dermatology

## 2023-11-10 ENCOUNTER — Ambulatory Visit: Payer: 59 | Admitting: Dermatology

## 2023-11-10 DIAGNOSIS — C4441 Basal cell carcinoma of skin of scalp and neck: Secondary | ICD-10-CM

## 2023-11-10 NOTE — Patient Instructions (Signed)

## 2023-11-10 NOTE — Progress Notes (Signed)
   Follow-Up Visit   Subjective  Erin Smith is a 63 y.o. female who presents for the following: BCC bx proven, ant neck base, pt presents for [facility] The patient has spots, moles and lesions to be evaluated, some may be new or changing and the patient may have concern these could be cancer.   The following portions of the chart were reviewed this encounter and updated as appropriate: medications, allergies, medical history  Review of Systems:  No other skin or systemic complaints except as noted in HPI or Assessment and Plan.  Objective  Well appearing patient in no apparent distress; mood and affect are within normal limits.   A focused examination was performed of the following areas: neck  Relevant exam findings are noted in the Assessment and Plan.  ant neck base Pink scar like macule, residual sBCC, scar from bx site  Assessment & Plan     BASAL CELL CARCINOMA (BCC) OF SKIN OF NECK ant neck base Destruction of lesion Complexity: simple   Destruction method: electrodesiccation and curettage   Informed consent: discussed and consent obtained   Timeout:  patient name, date of birth, surgical site, and procedure verified Procedure prep:  Patient was prepped and draped in usual sterile fashion Prep type:  Isopropyl alcohol Anesthesia: the lesion was anesthetized in a standard fashion   Anesthetic:  1% lidocaine  w/ epinephrine 1-100,000 local infiltration Curettage performed in three different directions: Yes   Electrodesiccation performed over the curetted area: Yes   Curettage cycles:  3 Final wound size (cm):  1 Hemostasis achieved with:  aluminum chloride and electrodesiccation Outcome: patient tolerated procedure well with no complications   Post-procedure details: sterile dressing applied and wound care instructions given   Dressing type: bandage and bacitracin   Bx proven  Return in about 6 months (around 05/09/2024) for TBSE, Hx of BCC, Hx of AKs.  I,  Grayce Saunas, RMA, am acting as scribe for Boneta Sharps, MD .   Documentation: I have reviewed the above documentation for accuracy and completeness, and I agree with the above.  Boneta Sharps, MD

## 2023-11-15 ENCOUNTER — Encounter: Payer: Self-pay | Admitting: Dermatology

## 2024-02-25 ENCOUNTER — Ambulatory Visit: Payer: Self-pay | Admitting: Internal Medicine

## 2024-02-25 ENCOUNTER — Encounter: Payer: Self-pay | Admitting: Internal Medicine

## 2024-02-25 VITALS — BP 98/66 | HR 85 | Ht 70.0 in | Wt 149.5 lb

## 2024-02-25 DIAGNOSIS — R002 Palpitations: Secondary | ICD-10-CM

## 2024-02-25 DIAGNOSIS — R7303 Prediabetes: Secondary | ICD-10-CM

## 2024-02-25 DIAGNOSIS — F419 Anxiety disorder, unspecified: Secondary | ICD-10-CM | POA: Diagnosis not present

## 2024-02-25 LAB — POCT GLYCOSYLATED HEMOGLOBIN (HGB A1C): Hemoglobin A1C: 5.7 % — AB (ref 4.0–5.6)

## 2024-02-25 MED ORDER — ALPRAZOLAM 0.25 MG PO TABS: ORAL_TABLET | ORAL | 0 refills | Status: DC

## 2024-02-25 NOTE — Assessment & Plan Note (Signed)
 Blood pressure is well controlled.  Current medications propranolol  - no palpitations noted Will continue same regimen along with efforts to limit dietary sodium.
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
## 2024-02-25 NOTE — Assessment & Plan Note (Signed)
 Controlled with diet only. A1C today = 5.7

## 2024-02-25 NOTE — Assessment & Plan Note (Addendum)
 Mild intermittent symptoms. Takes Xanax  PRN - last Rx #30 in 09/2023

## 2024-05-09 ENCOUNTER — Ambulatory Visit: Payer: 59 | Admitting: Dermatology

## 2024-05-16 ENCOUNTER — Encounter: Payer: Self-pay | Admitting: Dermatology

## 2024-05-16 ENCOUNTER — Other Ambulatory Visit: Payer: Self-pay | Admitting: Dermatology

## 2024-05-16 ENCOUNTER — Telehealth: Payer: Self-pay

## 2024-05-16 ENCOUNTER — Ambulatory Visit: Admitting: Dermatology

## 2024-05-16 DIAGNOSIS — D2372 Other benign neoplasm of skin of left lower limb, including hip: Secondary | ICD-10-CM

## 2024-05-16 DIAGNOSIS — W57XXXA Bitten or stung by nonvenomous insect and other nonvenomous arthropods, initial encounter: Secondary | ICD-10-CM

## 2024-05-16 DIAGNOSIS — H0262 Xanthelasma of right lower eyelid: Secondary | ICD-10-CM | POA: Diagnosis not present

## 2024-05-16 DIAGNOSIS — L570 Actinic keratosis: Secondary | ICD-10-CM

## 2024-05-16 DIAGNOSIS — L821 Other seborrheic keratosis: Secondary | ICD-10-CM

## 2024-05-16 DIAGNOSIS — D239 Other benign neoplasm of skin, unspecified: Secondary | ICD-10-CM

## 2024-05-16 DIAGNOSIS — L578 Other skin changes due to chronic exposure to nonionizing radiation: Secondary | ICD-10-CM

## 2024-05-16 DIAGNOSIS — L738 Other specified follicular disorders: Secondary | ICD-10-CM

## 2024-05-16 DIAGNOSIS — W908XXA Exposure to other nonionizing radiation, initial encounter: Secondary | ICD-10-CM

## 2024-05-16 DIAGNOSIS — Z7189 Other specified counseling: Secondary | ICD-10-CM

## 2024-05-16 DIAGNOSIS — L905 Scar conditions and fibrosis of skin: Secondary | ICD-10-CM

## 2024-05-16 DIAGNOSIS — L814 Other melanin hyperpigmentation: Secondary | ICD-10-CM

## 2024-05-16 DIAGNOSIS — Z1283 Encounter for screening for malignant neoplasm of skin: Secondary | ICD-10-CM

## 2024-05-16 DIAGNOSIS — H0260 Xanthelasma of unspecified eye, unspecified eyelid: Secondary | ICD-10-CM | POA: Insufficient documentation

## 2024-05-16 DIAGNOSIS — D1801 Hemangioma of skin and subcutaneous tissue: Secondary | ICD-10-CM

## 2024-05-16 DIAGNOSIS — D229 Melanocytic nevi, unspecified: Secondary | ICD-10-CM

## 2024-05-16 MED ORDER — FLUOROURACIL 5 % EX CREA: TOPICAL_CREAM | Freq: Two times a day (BID) | CUTANEOUS | 1 refills | Status: DC

## 2024-05-16 MED ORDER — FLUOROURACIL 5 % EX CREA: TOPICAL_CREAM | CUTANEOUS | 2 refills | Status: AC

## 2024-05-16 MED ORDER — FLUOROURACIL 5 % EX CREA: TOPICAL_CREAM | CUTANEOUS | 2 refills | Status: DC

## 2024-05-16 NOTE — Patient Instructions (Signed)
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
## 2024-05-16 NOTE — Telephone Encounter (Signed)
 Left message for patient to advise her [facility] no longer compounds 69fu and Calcipotriene, per Dr. Claudene send rx to [facility].    [facility] [address] [place], [place] [address]  Phone: [phone] TOLL-FREE: [phone]

## 2024-05-24 LAB — HM MAMMOGRAPHY

## 2024-05-25 ENCOUNTER — Encounter: Payer: Self-pay | Admitting: Internal Medicine

## 2024-09-06 ENCOUNTER — Encounter: Payer: Self-pay | Admitting: Internal Medicine

## 2024-09-06 ENCOUNTER — Ambulatory Visit (INDEPENDENT_AMBULATORY_CARE_PROVIDER_SITE_OTHER): Admitting: Internal Medicine

## 2024-09-06 VITALS — BP 108/66 | HR 77 | Ht 70.0 in | Wt 152.0 lb

## 2024-09-06 DIAGNOSIS — Z0000 Encounter for general adult medical examination without abnormal findings: Secondary | ICD-10-CM

## 2024-09-06 DIAGNOSIS — G43909 Migraine, unspecified, not intractable, without status migrainosus: Secondary | ICD-10-CM

## 2024-09-06 DIAGNOSIS — E785 Hyperlipidemia, unspecified: Secondary | ICD-10-CM

## 2024-09-06 DIAGNOSIS — R002 Palpitations: Secondary | ICD-10-CM

## 2024-09-06 DIAGNOSIS — K219 Gastro-esophageal reflux disease without esophagitis: Secondary | ICD-10-CM | POA: Diagnosis not present

## 2024-09-06 DIAGNOSIS — R7303 Prediabetes: Secondary | ICD-10-CM | POA: Diagnosis not present

## 2024-09-06 DIAGNOSIS — F419 Anxiety disorder, unspecified: Secondary | ICD-10-CM

## 2024-09-06 MED ORDER — PROPRANOLOL HCL 20 MG PO TABS: 20.0000 mg | ORAL_TABLET | Freq: Two times a day (BID) | ORAL | 1 refills | Status: AC

## 2024-09-06 MED ORDER — ESOMEPRAZOLE MAGNESIUM 40 MG PO CPDR: 40.0000 mg | DELAYED_RELEASE_CAPSULE | Freq: Every day | ORAL | 1 refills | Status: AC

## 2024-09-06 MED ORDER — ALPRAZOLAM 0.25 MG PO TABS: ORAL_TABLET | ORAL | 0 refills | Status: AC

## 2024-09-06 NOTE — Assessment & Plan Note (Signed)
 Mild intermittent anxiety symptoms. Relieved with prn alprazolam  - uses about 30 tabs per 6 months. Will refill.
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
## 2024-09-06 NOTE — Assessment & Plan Note (Signed)
 Not currently on medication due to low 10 yr risk Continue diet and exercise.

## 2024-09-06 NOTE — Assessment & Plan Note (Signed)
 Currently taking Nexium  with minimal reflux symptoms. Patient denies red flag symptoms - no melena, weight loss, dysphagia. Will maintain current management.

## 2024-09-06 NOTE — Assessment & Plan Note (Addendum)
 On beta blocker daily for prevention. She has not had a migraine in several years.

## 2024-09-06 NOTE — Assessment & Plan Note (Signed)
 Well controlled with diet and exercise. Lab Results  Component Value Date   HGBA1C 5.7 (A) 02/25/2024

## 2024-09-06 NOTE — Assessment & Plan Note (Signed)
 Seen by Cardiology and on beta blocker daily. Symptoms well controlled.

## 2024-09-07 ENCOUNTER — Ambulatory Visit: Payer: Self-pay | Admitting: Internal Medicine

## 2024-09-07 DIAGNOSIS — E785 Hyperlipidemia, unspecified: Secondary | ICD-10-CM

## 2024-09-07 LAB — MICROSCOPIC EXAMINATION
Casts: NONE SEEN /LPF
RBC, Urine: NONE SEEN /HPF (ref 0–2)

## 2024-09-07 LAB — CBC WITH DIFFERENTIAL/PLATELET
Basophils Absolute: 0.1 x10E3/uL (ref 0.0–0.2)
Basos: 1 %
EOS (ABSOLUTE): 0.1 x10E3/uL (ref 0.0–0.4)
Eos: 2 %
Hematocrit: 39.2 % (ref 34.0–46.6)
Hemoglobin: 12.9 g/dL (ref 11.1–15.9)
Immature Grans (Abs): 0 x10E3/uL (ref 0.0–0.1)
Immature Granulocytes: 0 %
Lymphocytes Absolute: 1.8 x10E3/uL (ref 0.7–3.1)
Lymphs: 39 %
MCH: 30.2 pg (ref 26.6–33.0)
MCHC: 32.9 g/dL (ref 31.5–35.7)
MCV: 92 fL (ref 79–97)
Monocytes Absolute: 0.4 x10E3/uL (ref 0.1–0.9)
Monocytes: 7 %
Neutrophils Absolute: 2.4 x10E3/uL (ref 1.4–7.0)
Neutrophils: 51 %
Platelets: 283 x10E3/uL (ref 150–450)
RBC: 4.27 x10E6/uL (ref 3.77–5.28)
RDW: 12.7 % (ref 11.7–15.4)
WBC: 4.7 x10E3/uL (ref 3.4–10.8)

## 2024-09-07 LAB — URINALYSIS, ROUTINE W REFLEX MICROSCOPIC
Bilirubin, UA: NEGATIVE
Glucose, UA: NEGATIVE
Ketones, UA: NEGATIVE
Nitrite, UA: NEGATIVE
Protein,UA: NEGATIVE
RBC, UA: NEGATIVE
Specific Gravity, UA: 1.017 (ref 1.005–1.030)
Urobilinogen, Ur: 0.2 mg/dL (ref 0.2–1.0)
pH, UA: 5.5 (ref 5.0–7.5)

## 2024-09-07 LAB — COMPREHENSIVE METABOLIC PANEL WITH GFR
ALT: 11 IU/L (ref 0–32)
AST: 18 IU/L (ref 0–40)
Albumin: 4.9 g/dL (ref 3.9–4.9)
Alkaline Phosphatase: 72 IU/L (ref 49–135)
BUN/Creatinine Ratio: 9 — ABNORMAL LOW (ref 12–28)
BUN: 7 mg/dL — ABNORMAL LOW (ref 8–27)
Bilirubin Total: 0.3 mg/dL (ref 0.0–1.2)
CO2: 22 mmol/L (ref 20–29)
Calcium: 10.2 mg/dL (ref 8.7–10.3)
Chloride: 101 mmol/L (ref 96–106)
Creatinine, Ser: 0.79 mg/dL (ref 0.57–1.00)
Globulin, Total: 2.3 g/dL (ref 1.5–4.5)
Glucose: 95 mg/dL (ref 70–99)
Potassium: 4.3 mmol/L (ref 3.5–5.2)
Sodium: 141 mmol/L (ref 134–144)
Total Protein: 7.2 g/dL (ref 6.0–8.5)
eGFR: 85 mL/min/1.73 (ref >59)

## 2024-09-07 LAB — LIPID PANEL
Chol/HDL Ratio: 3.9 ratio (ref 0.0–4.4)
Cholesterol, Total: 272 mg/dL — ABNORMAL HIGH (ref 100–199)
HDL: 70 mg/dL (ref >39)
LDL Chol Calc (NIH): 184 mg/dL — ABNORMAL HIGH (ref 0–99)
Triglycerides: 103 mg/dL (ref 0–149)
VLDL Cholesterol Cal: 18 mg/dL (ref 5–40)

## 2024-09-07 LAB — TSH: TSH: 1.19 u[IU]/mL (ref 0.450–4.500)

## 2024-09-07 LAB — HEMOGLOBIN A1C
Est. average glucose Bld gHb Est-mCnc: 123 mg/dL
Hgb A1c MFr Bld: 5.9 % — ABNORMAL HIGH (ref 4.8–5.6)

## 2024-10-05 ENCOUNTER — Ambulatory Visit: Payer: BC Managed Care – PPO | Admitting: Dermatology

## 2024-11-14 ENCOUNTER — Encounter: Payer: Self-pay | Admitting: Dermatology

## 2024-11-14 ENCOUNTER — Ambulatory Visit: Admitting: Dermatology

## 2024-11-14 DIAGNOSIS — D2372 Other benign neoplasm of skin of left lower limb, including hip: Secondary | ICD-10-CM | POA: Diagnosis not present

## 2024-11-14 DIAGNOSIS — D1801 Hemangioma of skin and subcutaneous tissue: Secondary | ICD-10-CM

## 2024-11-14 DIAGNOSIS — Z1283 Encounter for screening for malignant neoplasm of skin: Secondary | ICD-10-CM | POA: Diagnosis not present

## 2024-11-14 DIAGNOSIS — L814 Other melanin hyperpigmentation: Secondary | ICD-10-CM

## 2024-11-14 DIAGNOSIS — L578 Other skin changes due to chronic exposure to nonionizing radiation: Secondary | ICD-10-CM | POA: Diagnosis not present

## 2024-11-14 DIAGNOSIS — L820 Inflamed seborrheic keratosis: Secondary | ICD-10-CM

## 2024-11-14 DIAGNOSIS — L905 Scar conditions and fibrosis of skin: Secondary | ICD-10-CM | POA: Diagnosis not present

## 2024-11-14 DIAGNOSIS — L821 Other seborrheic keratosis: Secondary | ICD-10-CM | POA: Diagnosis not present

## 2024-11-14 DIAGNOSIS — W908XXA Exposure to other nonionizing radiation, initial encounter: Secondary | ICD-10-CM | POA: Diagnosis not present

## 2024-11-14 DIAGNOSIS — D239 Other benign neoplasm of skin, unspecified: Secondary | ICD-10-CM

## 2024-11-14 DIAGNOSIS — Q825 Congenital non-neoplastic nevus: Secondary | ICD-10-CM | POA: Diagnosis not present

## 2024-11-14 DIAGNOSIS — D229 Melanocytic nevi, unspecified: Secondary | ICD-10-CM

## 2024-11-14 DIAGNOSIS — Z85828 Personal history of other malignant neoplasm of skin: Secondary | ICD-10-CM
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
## 2024-11-14 NOTE — Patient Instructions (Signed)

## 2025-03-06 ENCOUNTER — Ambulatory Visit: Admitting: Student

## 2025-05-14 ENCOUNTER — Ambulatory Visit: Admitting: Dermatology
